# Patient Record
Sex: Male | Born: 1980
Health system: Southern US, Community
[De-identification: ages and names within clinical notes are randomized; demographics above are authoritative.]

## PROBLEM LIST (undated history)

## (undated) DIAGNOSIS — G4733 Obstructive sleep apnea (adult) (pediatric): Secondary | ICD-10-CM

## (undated) DIAGNOSIS — M5030 Other cervical disc degeneration, unspecified cervical region: Secondary | ICD-10-CM

## (undated) DIAGNOSIS — F32A Depression, unspecified: Secondary | ICD-10-CM

## (undated) DIAGNOSIS — Z9989 Dependence on other enabling machines and devices: Secondary | ICD-10-CM

## (undated) DIAGNOSIS — F329 Major depressive disorder, single episode, unspecified: Secondary | ICD-10-CM

## (undated) DIAGNOSIS — F419 Anxiety disorder, unspecified: Secondary | ICD-10-CM

## (undated) DIAGNOSIS — Z92241 Personal history of systemic steroid therapy: Secondary | ICD-10-CM

## (undated) HISTORY — DX: Depression, unspecified: F32.A

## (undated) HISTORY — DX: Anxiety disorder, unspecified: F41.9

## (undated) HISTORY — PX: WISDOM TOOTH EXTRACTION: SHX21

## (undated) HISTORY — DX: Major depressive disorder, single episode, unspecified: F32.9

---
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
## 2017-04-30 NOTE — Progress Notes (Signed)
BP (!) 152/94 (BP Location: Left Arm, Patient Position: Sitting, Cuff Size: Large)   Pulse 98   Temp 97.6 F (36.4 C)   Ht 5' 7.1" (1.704 m)   Wt 223 lb 4 oz (101.3 kg)   SpO2 97%   BMI 34.86 kg/m    Subjective:    Patient ID: Latrelle Bazar, male    DOB: 1980/06/14, 37 y.o.   MRN: [id number]  HPI: Rishabh Rinkenberger is a 37 y.o. male who presents today to establish care.  Chief Complaint  Patient presents with  . Back Pain  . Insomnia  . Allergies   Has been having some pain in his upper back off and on for 4-5 years. It's been pretty bad for the past 1.5 months. Was in the [employer] and that was pretty tough on him. Was seeing a PT at [place]- thought that he might have thoracic outlet syndrome. PT did help a bit, but he stopped going due to issues with the [facility]. Pain is more of an aching pain and then becomes stabbing. Stays right at the shoulder blades. Has some numbness and tingling in his hands, not as bad as it has been. Doesn't seem to be associated with the pain. Has not had any imaging done of his back. Had an EMG of his arms done at the [place] for the numbness and tingling. Hasn't done anything with his arms since then. Comes and goes. Feels better with advil and tylenol. Heat helps, but it's still there. Laying flat on his back helps. Has been rolling it out with tennis balls.   Allergies have been getting worse for the past couple of years. Has been taking the afrin as needed. Using the claritin for 2 months straight and then stopped it. Mainly has itchy eyes, congestion and coughing.   SLEEP APNEA Sleep apnea status: uncontrolled Duration: couple of years Satisfied with current treatment?:  Not on anything CPAP use:  no Sleep quality with CPAP use: poor Treament compliance:Not on anything Last sleep study: never had one Treatments attempted: None Wakes feeling refreshed:  no Daytime hypersomnolence:  yes Fatigue:  yes Insomnia:  yes Good sleep hygiene:  yes Difficulty falling  asleep:  yes Difficulty staying asleep:  yes Snoring bothers bed partner:  yes Observed apnea by bed partner: yes Obesity:  yes Hypertension: yes  Pulmonary hypertension:  no Coronary artery disease:  no   Active Ambulatory Problems    Diagnosis Date Noted  . Environmental allergies 05/01/2017   Resolved Ambulatory Problems    Diagnosis Date Noted  . No Resolved Ambulatory Problems   Past Medical History:  Diagnosis Date  . Anxiety   . Depression    Past Surgical History:  Procedure Laterality Date  . WISDOM TOOTH EXTRACTION  37yo   37yo    Outpatient Encounter Medications as of 05/01/2017  Medication Sig  . loratadine (CLARITIN) 10 MG tablet Take 10 mg by mouth daily.  Marland Kitchen oxymetazoline (AFRIN) 0.05 % nasal spray Place 1 spray into both nostrils 2 (two) times daily.  . cyclobenzaprine (FLEXERIL) 10 MG tablet Take 1 tablet (10 mg total) by mouth at bedtime.  . fexofenadine (ALLEGRA ALLERGY) 180 MG tablet Take 1 tablet (180 mg total) by mouth daily.  . fluticasone (FLONASE) 50 MCG/ACT nasal spray Place 2 sprays into both nostrils daily.  . naproxen (NAPROSYN) 500 MG tablet Take 1 tablet (500 mg total) by mouth 2 (two) times daily with a meal.   No facility-administered encounter medications on  file as of 05/01/2017.    No Known Allergies  Social History   Socioeconomic History  . Marital status: Single    Spouse name: Not on file  . Number of children: Not on file  . Years of education: Not on file  . Highest education level: Not on file  Occupational History  . Not on file  Social Needs  . Financial resource strain: Not on file  . Food insecurity:    Worry: Not on file    Inability: Not on file  . Transportation needs:    Medical: Not on file    Non-medical: Not on file  Tobacco Use  . Smoking status: Former Smoker    Types: Cigarettes  . Smokeless tobacco: Former Neurosurgeon    Types: Chew  Substance and Sexual Activity  . Alcohol use: Yes    Comment: on  occasion  . Drug use: Never  . Sexual activity: Yes  Lifestyle  . Physical activity:    Days per week: Not on file    Minutes per session: Not on file  . Stress: Not on file  Relationships  . Social connections:    Talks on phone: Not on file    Gets together: Not on file    Attends religious service: Not on file    Active member of club or organization: Not on file    Attends meetings of clubs or organizations: Not on file    Relationship status: Not on file  . Intimate partner violence:    Fear of current or ex partner: Not on file    Emotionally abused: Not on file    Physically abused: Not on file    Forced sexual activity: Not on file  Other Topics Concern  . Not on file  Social History Narrative  . Not on file   Family History  Problem Relation Age of Onset  . Drug abuse Father   . Multiple sclerosis Father   . Cancer Maternal Grandmother        Breast and lung     Review of Systems  Constitutional: Negative for activity change, appetite change, chills, diaphoresis, fatigue, fever and unexpected weight change.  HENT: Positive for congestion, sinus pressure and trouble swallowing (has trouble swallowing things, has to throw it up, just solids). Negative for dental problem, drooling, ear discharge, ear pain, facial swelling, hearing loss, mouth sores, nosebleeds, postnasal drip, rhinorrhea, sinus pain, sneezing, sore throat, tinnitus and voice change.   Eyes: Positive for redness and itching. Negative for photophobia, pain, discharge and visual disturbance.  Respiratory: Negative.   Cardiovascular: Negative.   Gastrointestinal: Negative.   Psychiatric/Behavioral: Negative.     Per HPI unless specifically indicated above     Objective:    BP (!) 152/94 (BP Location: Left Arm, Patient Position: Sitting, Cuff Size: Large)   Pulse 98   Temp 97.6 F (36.4 C)   Ht 5' 7.1" (1.704 m)   Wt 223 lb 4 oz (101.3 kg)   SpO2 97%   BMI 34.86 kg/m   Wt Readings from Last  3 Encounters:  05/01/17 223 lb 4 oz (101.3 kg)    Physical Exam  Constitutional: He is oriented to person, place, and time. He appears well-developed and well-nourished. No distress.  HENT:  Head: Normocephalic and atraumatic.  Right Ear: Hearing normal.  Left Ear: Hearing normal.  Nose: Nose normal.  Eyes: Conjunctivae and lids are normal. Right eye exhibits no discharge. Left eye exhibits no discharge. No  scleral icterus.  Cardiovascular: Normal rate, regular rhythm, normal heart sounds and intact distal pulses. Exam reveals no gallop and no friction rub.  No murmur heard. Pulmonary/Chest: Effort normal and breath sounds normal. No respiratory distress. He has no wheezes. He has no rales. He exhibits no tenderness.  Neurological: He is alert and oriented to person, place, and time.  Skin: Skin is warm, dry and intact. No rash noted. He is not diaphoretic. No erythema. No pallor.  Psychiatric: He has a normal mood and affect. His speech is normal and behavior is normal. Judgment and thought content normal. Cognition and memory are normal.  Nursing note and vitals reviewed. Back Exam:    Inspection:  Normal spinal curvature.  No deformity, ecchymosis, erythema, or lesions    Palpation:     Midline spinal tenderness: yes      Paralumbar tenderness: no      Parathoracic tenderness: yes      Buttocks tenderness: no     Range of Motion:      Flexion: Fingers to Ground     Extension:Decreased     Lateral bending:Decreased    Rotation:Decreased    Neuro Exam:Lower extremity DTRs normal & symmetric.  Strength and sensation intact.   No results found for this or any previous visit.    Assessment & Plan:   Problem List Items Addressed This Visit      Other   Environmental allergies    Will treat with allegra and flonase. Call with any concerns. Continue to monitor. Call with any concerns.        Other Visit Diagnoses    Observed sleep apnea    -  Primary   Will obtain sleep  study. Call with any concerns. Await results.    Relevant Orders   Ambulatory referral to Sleep Studies   Chronic midline thoracic back pain       WIll obtain x-ray as he has not had any done. Will treat with naproxen and flexeril, if normal, consider OMT. Await results.    Relevant Medications   naproxen (NAPROSYN) 500 MG tablet   cyclobenzaprine (FLEXERIL) 10 MG tablet   Other Relevant Orders   DG Thoracic Spine W/Swimmers       Follow up plan: Return ASAP, for Procedure visit.

## 2017-05-01 ENCOUNTER — Encounter: Payer: Self-pay | Admitting: Family Medicine

## 2017-05-01 ENCOUNTER — Ambulatory Visit (INDEPENDENT_AMBULATORY_CARE_PROVIDER_SITE_OTHER): Payer: 59 | Admitting: Family Medicine

## 2017-05-01 VITALS — BP 152/94 | HR 98 | Temp 97.6°F | Ht 67.1 in | Wt 223.2 lb

## 2017-05-01 DIAGNOSIS — Z9109 Other allergy status, other than to drugs and biological substances: Secondary | ICD-10-CM | POA: Diagnosis not present

## 2017-05-01 DIAGNOSIS — M546 Pain in thoracic spine: Secondary | ICD-10-CM | POA: Diagnosis not present

## 2017-05-01 DIAGNOSIS — G4730 Sleep apnea, unspecified: Secondary | ICD-10-CM

## 2017-05-01 DIAGNOSIS — G8929 Other chronic pain: Secondary | ICD-10-CM | POA: Diagnosis not present

## 2017-05-01 MED ORDER — FLUTICASONE PROPIONATE 50 MCG/ACT NA SUSP: 2.0000 | Freq: Every day | NASAL | 6 refills | Status: DC

## 2017-05-01 MED ORDER — NAPROXEN 500 MG PO TABS: 500.0000 mg | ORAL_TABLET | Freq: Two times a day (BID) | ORAL | 1 refills | Status: DC

## 2017-05-01 MED ORDER — CYCLOBENZAPRINE HCL 10 MG PO TABS: 10.0000 mg | ORAL_TABLET | Freq: Every day | ORAL | 0 refills | Status: DC

## 2017-05-01 MED ORDER — FEXOFENADINE HCL 180 MG PO TABS: 180.0000 mg | ORAL_TABLET | Freq: Every day | ORAL | 1 refills | Status: DC

## 2017-05-01 NOTE — Assessment & Plan Note (Signed)
Will treat with allegra and flonase. Call with any concerns. Continue to monitor. Call with any concerns.

## 2017-05-02 ENCOUNTER — Telehealth: Payer: Self-pay | Admitting: Family Medicine

## 2017-05-02 ENCOUNTER — Ambulatory Visit
Admission: RE | Admit: 2017-05-02 | Discharge: 2017-05-02 | Disposition: A | Discharge status: Home / Self Care | Payer: 59 | Source: Ambulatory Visit | Attending: Family Medicine | Admitting: Family Medicine

## 2017-05-02 DIAGNOSIS — G8929 Other chronic pain: Secondary | ICD-10-CM

## 2017-05-02 DIAGNOSIS — M546 Pain in thoracic spine: Principal | ICD-10-CM

## 2017-05-02 NOTE — Telephone Encounter (Signed)
Please let him know that his x-ray was negative, so we'll see him next week to see what we can do for his back. Thanks!

## 2017-05-03 NOTE — Telephone Encounter (Signed)
Message relayed to patient. Verbalized understanding and denied questions.   

## 2017-05-09 ENCOUNTER — Encounter: Payer: Self-pay | Admitting: Family Medicine

## 2017-05-09 ENCOUNTER — Ambulatory Visit (INDEPENDENT_AMBULATORY_CARE_PROVIDER_SITE_OTHER): Payer: 59 | Admitting: Family Medicine

## 2017-05-09 VITALS — BP 130/85 | HR 96 | Wt 227.1 lb

## 2017-05-09 DIAGNOSIS — Z23 Encounter for immunization: Secondary | ICD-10-CM | POA: Diagnosis not present

## 2017-05-09 DIAGNOSIS — G8929 Other chronic pain: Secondary | ICD-10-CM | POA: Diagnosis not present

## 2017-05-09 DIAGNOSIS — M9908 Segmental and somatic dysfunction of rib cage: Secondary | ICD-10-CM | POA: Diagnosis not present

## 2017-05-09 DIAGNOSIS — M546 Pain in thoracic spine: Secondary | ICD-10-CM

## 2017-05-09 DIAGNOSIS — M9901 Segmental and somatic dysfunction of cervical region: Secondary | ICD-10-CM | POA: Diagnosis not present

## 2017-05-09 DIAGNOSIS — M9900 Segmental and somatic dysfunction of head region: Secondary | ICD-10-CM

## 2017-05-09 DIAGNOSIS — M9907 Segmental and somatic dysfunction of upper extremity: Secondary | ICD-10-CM

## 2017-05-09 DIAGNOSIS — M9909 Segmental and somatic dysfunction of abdomen and other regions: Secondary | ICD-10-CM

## 2017-05-09 DIAGNOSIS — M9902 Segmental and somatic dysfunction of thoracic region: Secondary | ICD-10-CM | POA: Diagnosis not present

## 2017-05-09 NOTE — Progress Notes (Signed)
BP 130/85 (BP Location: Left Arm, Patient Position: Sitting, Cuff Size: Large)   Pulse 96   Wt 227 lb 2 oz (103 kg)   SpO2 94%   BMI 35.47 kg/m    Subjective:    Patient ID: Shawn Quinn, male    DOB: 02-Aug-1980, 37 y.o.   MRN: [id number]  HPI: Shawn Quinn is a 37 y.o. male  Chief Complaint  Patient presents with  . Back Pain   Shawn Quinn presents today for evaluation and possible treatment with OMT for chronic midline thoracic pain, with a negative x-ray. Pain is located in the middle of his upper back between his shoulder blades. Its aching and stabbing in nature. Feels better with advil and tylenol and worse with certain movements. He was putting together some shelves over the weekend and developed a sharp pain. It's located in his upper back, comes into his chest. Constant for yesterday, better today- now just stiff. No numbness or tingling. No weakness. Better with naproxen and flexeril- didn't help much with this one. Nothing making it worse. Otherwise doing well with no other concerns or complaints at this time.   Relevant past medical, surgical, family and social history reviewed and updated as indicated. Interim medical history since our last visit reviewed. Allergies and medications reviewed and updated.  Review of Systems  Constitutional: Negative.   Respiratory: Negative.   Cardiovascular: Negative.   Musculoskeletal: Positive for back pain and myalgias. Negative for arthralgias, gait problem, joint swelling, neck pain and neck stiffness.  Skin: Negative.   Neurological: Negative.   Psychiatric/Behavioral: Negative.     Per HPI unless specifically indicated above     Objective:    BP 130/85 (BP Location: Left Arm, Patient Position: Sitting, Cuff Size: Large)   Pulse 96   Wt 227 lb 2 oz (103 kg)   SpO2 94%   BMI 35.47 kg/m   Wt Readings from Last 3 Encounters:  05/09/17 227 lb 2 oz (103 kg)  05/01/17 223 lb 4 oz (101.3 kg)    Physical Exam  Constitutional: He  is oriented to person, place, and time. He appears well-developed and well-nourished. No distress.  HENT:  Head: Normocephalic and atraumatic.  Right Ear: Hearing normal.  Left Ear: Hearing normal.  Nose: Nose normal.  Eyes: Conjunctivae and lids are normal. Right eye exhibits no discharge. Left eye exhibits no discharge. No scleral icterus.  Cardiovascular: Normal rate, regular rhythm, normal heart sounds and intact distal pulses. Exam reveals no gallop and no friction rub.  No murmur heard. Pulmonary/Chest: Effort normal and breath sounds normal. No stridor. No respiratory distress. He has no wheezes. He has no rales. He exhibits no tenderness.  Abdominal: Soft. He exhibits no distension and no mass. There is no tenderness. There is no rebound and no guarding. No hernia.  Neurological: He is alert and oriented to person, place, and time.  Skin: Skin is warm, dry and intact. Capillary refill takes less than 2 seconds. No rash noted. He is not diaphoretic. No erythema. No pallor.  Psychiatric: He has a normal mood and affect. His speech is normal and behavior is normal. Judgment and thought content normal. Cognition and memory are normal.  Nursing note and vitals reviewed. Musculoskeletal:  Exam found Decreased ROM, Tissue texture changes, Tenderness to palpation and Asymmetry of patient's  head, neck, thorax, ribs, upper extremity and abdomen Osteopathic Structural Exam:   Head: hypertonic suboccipital muscles, OAESSR  Neck: trap spasm on the R, C3ESRR, SCM hypertonic on  the R  Thorax: T6ESRL, trap spasm on the R  Ribs: Ribs 5-7 locked up on the L, Rib 7 locked up on the R  Upper Extremity: pec spasm bilaterally L>R  Abdomen:  Diaphragm hypertonic bilaterally R>L  No results found for this or any previous visit.    Assessment & Plan:   Problem List Items Addressed This Visit    None    Visit Diagnoses    Chronic midline thoracic back pain    -  Primary   In acute exacerbation at  this time, likely myofascial in nature. I think he would benefit from OMT. Treated today with good results as below.    Immunization due       Tdap given today.   Relevant Orders   Tdap vaccine greater than or equal to 7yo IM (Completed)   Head region somatic dysfunction       Cervical segment dysfunction       Thoracic segment dysfunction       Somatic dysfunction of upper extremities       Rib cage region somatic dysfunction       Segmental dysfunction of abdomen         After verbal consent was obtained, patient was treated today with osteopathic manipulative medicine to the regions of the head, neck, thorax, ribs, abdomen and upper extremity using the techniques of FPR, myofascial release, counterstrain, muscle energy, HVLA and soft tissue. Areas of compensation relating to his primary pain source also treated. Patient tolerated the procedure well with good objective and good subjective improvement in symptoms. He left the room in good condition. He was advised to stay well hydrated and that he may have some soreness following the procedure. If not improving or worsening, he will call and come in. He will return for reevaluation   in 2-3 weeks.   Follow up plan: Return 2-3 weeks, for Procedure visit.

## 2017-05-24 ENCOUNTER — Ambulatory Visit (INDEPENDENT_AMBULATORY_CARE_PROVIDER_SITE_OTHER): Payer: 59 | Admitting: Family Medicine

## 2017-05-24 ENCOUNTER — Encounter: Payer: Self-pay | Admitting: Family Medicine

## 2017-05-24 VITALS — BP 149/102 | HR 97 | Temp 97.4°F | Wt 227.3 lb

## 2017-05-24 DIAGNOSIS — G8929 Other chronic pain: Secondary | ICD-10-CM

## 2017-05-24 DIAGNOSIS — M9908 Segmental and somatic dysfunction of rib cage: Secondary | ICD-10-CM

## 2017-05-24 DIAGNOSIS — M9909 Segmental and somatic dysfunction of abdomen and other regions: Secondary | ICD-10-CM

## 2017-05-24 DIAGNOSIS — M9900 Segmental and somatic dysfunction of head region: Secondary | ICD-10-CM

## 2017-05-24 DIAGNOSIS — M546 Pain in thoracic spine: Secondary | ICD-10-CM | POA: Diagnosis not present

## 2017-05-24 DIAGNOSIS — M9902 Segmental and somatic dysfunction of thoracic region: Secondary | ICD-10-CM | POA: Diagnosis not present

## 2017-05-24 DIAGNOSIS — M9901 Segmental and somatic dysfunction of cervical region: Secondary | ICD-10-CM | POA: Diagnosis not present

## 2017-05-24 NOTE — Progress Notes (Signed)
BP (!) 149/102 (BP Location: Left Arm, Cuff Size: Large)   Pulse 97   Temp (!) 97.4 F (36.3 C)   Wt 227 lb 5 oz (103.1 kg)   SpO2 97%   BMI 35.50 kg/m    Subjective:    Patient ID: Shawn Quinn, male    DOB: 05-23-1980, 37 y.o.   MRN: [id number]  HPI: Shawn Quinn is a 37 y.o. male  Chief Complaint  Patient presents with  . Back Pain   Woke up this AM and his back was killing him. Was on vacation last week and slept on a couch. Did well after last time, not as locked up. Lasted until he went on vacation last week. He notes that it is midline in the middle of his back. Aching and stabbing in nature. Some radiation into his chest. Better with OMT and stretching and medicine. Nothing makes it particularly worse. He is otherwise doing well with no other concerns or complaints at this time.   Relevant past medical, surgical, family and social history reviewed and updated as indicated. Interim medical history since our last visit reviewed. Allergies and medications reviewed and updated.  Review of Systems  Constitutional: Negative.   Respiratory: Negative.   Cardiovascular: Negative.   Musculoskeletal: Positive for back pain and myalgias. Negative for arthralgias, gait problem, joint swelling, neck pain and neck stiffness.  Skin: Negative.   Neurological: Negative.   Psychiatric/Behavioral: Negative.     Per HPI unless specifically indicated above     Objective:    BP (!) 149/102 (BP Location: Left Arm, Cuff Size: Large)   Pulse 97   Temp (!) 97.4 F (36.3 C)   Wt 227 lb 5 oz (103.1 kg)   SpO2 97%   BMI 35.50 kg/m   Wt Readings from Last 3 Encounters:  05/24/17 227 lb 5 oz (103.1 kg)  05/09/17 227 lb 2 oz (103 kg)  05/01/17 223 lb 4 oz (101.3 kg)    Physical Exam  Constitutional: He is oriented to person, place, and time. He appears well-developed and well-nourished. No distress.  HENT:  Head: Normocephalic and atraumatic.  Right Ear: Hearing normal.  Left Ear:  Hearing normal.  Nose: Nose normal.  Eyes: Conjunctivae and lids are normal. Right eye exhibits no discharge. Left eye exhibits no discharge. No scleral icterus.  Cardiovascular: Normal rate, regular rhythm, normal heart sounds and intact distal pulses. Exam reveals no gallop and no friction rub.  No murmur heard. Pulmonary/Chest: Effort normal and breath sounds normal. No stridor. No respiratory distress. He has no wheezes. He has no rales. He exhibits no tenderness.  Neurological: He is alert and oriented to person, place, and time.  Skin: Skin is warm, dry and intact. Capillary refill takes less than 2 seconds. No rash noted. He is not diaphoretic. No erythema. No pallor.  Psychiatric: He has a normal mood and affect. His speech is normal and behavior is normal. Judgment and thought content normal. Cognition and memory are normal.  Nursing note and vitals reviewed. Musculoskeletal:  Exam found Decreased ROM, Tissue texture changes, Tenderness to palpation and Asymmetry of patient's  head, neck, thorax, ribs and abdomen Osteopathic Structural Exam:   Head: hypertonic suboccipital muscles, OAESSR  Neck: C3ESRR, C4ESRL, SCM hypertonic bilaterally R>L  Thorax: Trap spasm bilaterally, T3-5SLRR  Ribs: ribs 3-8 locked up on the R, Ribs 5-7 locked up on the L  Abdomen: diaphragm spasm on the L    No results found for this or  any previous visit.    Assessment & Plan:   Problem List Items Addressed This Visit    None    Visit Diagnoses    Chronic midline thoracic back pain    -  Primary   In acute exacerbation from sleeping on a couch. He has somatic dysfunction contributing to his symptoms. Would benefit from OMT. Treated today as below.    Head region somatic dysfunction       Cervical segment dysfunction       Thoracic segment dysfunction       Rib cage region somatic dysfunction       Segmental dysfunction of abdomen         After verbal consent was obtained, patient was treated today  with osteopathic manipulative medicine to the regions of the head, neck, thorax, ribs and abdomen using the techniques of cranial, myofascial release, counterstrain, muscle energy, HVLA and soft tissue. Areas of compensation relating to his primary pain source also treated. Patient tolerated the procedure well with good objective and good subjective improvement in symptoms. He left the room in good condition. He was advised to stay well hydrated and that he may have some soreness following the procedure. If not improving or worsening, he will call and come in. He will return for reevaluation   in 2-3 weeks if needed.   Follow up plan: Return 2-3 weeks, for Procedure visit.

## 2017-06-01 ENCOUNTER — Other Ambulatory Visit: Payer: Self-pay | Admitting: Family Medicine

## 2017-06-07 ENCOUNTER — Ambulatory Visit (INDEPENDENT_AMBULATORY_CARE_PROVIDER_SITE_OTHER): Payer: 59 | Admitting: Family Medicine

## 2017-06-07 ENCOUNTER — Encounter: Payer: Self-pay | Admitting: Family Medicine

## 2017-06-07 VITALS — BP 130/90 | HR 87 | Temp 98.4°F | Wt 223.6 lb

## 2017-06-07 DIAGNOSIS — M9908 Segmental and somatic dysfunction of rib cage: Secondary | ICD-10-CM | POA: Diagnosis not present

## 2017-06-07 DIAGNOSIS — M9909 Segmental and somatic dysfunction of abdomen and other regions: Secondary | ICD-10-CM

## 2017-06-07 DIAGNOSIS — M9907 Segmental and somatic dysfunction of upper extremity: Secondary | ICD-10-CM

## 2017-06-07 DIAGNOSIS — G8929 Other chronic pain: Secondary | ICD-10-CM | POA: Diagnosis not present

## 2017-06-07 DIAGNOSIS — M9902 Segmental and somatic dysfunction of thoracic region: Secondary | ICD-10-CM | POA: Diagnosis not present

## 2017-06-07 DIAGNOSIS — M546 Pain in thoracic spine: Secondary | ICD-10-CM

## 2017-06-07 MED ORDER — TRAMADOL HCL 50 MG PO TABS: 50.0000 mg | ORAL_TABLET | Freq: Three times a day (TID) | ORAL | 0 refills | Status: DC | PRN

## 2017-06-07 MED ORDER — TRIAMCINOLONE ACETONIDE 40 MG/ML IJ SUSP
40.0000 mg | Freq: Once | INTRAMUSCULAR | Status: AC
  Administered 2017-06-07: 40 mg via INTRAMUSCULAR

## 2017-06-07 MED ORDER — PREDNISONE 50 MG PO TABS: 50.0000 mg | ORAL_TABLET | Freq: Every day | ORAL | 0 refills | Status: DC

## 2017-06-07 NOTE — Progress Notes (Signed)
BP 130/90 (BP Location: Left Arm, Patient Position: Sitting, Cuff Size: Large)   Pulse 87   Temp 98.4 F (36.9 C)   Wt 223 lb 9 oz (101.4 kg)   SpO2 98%   BMI 34.91 kg/m    Subjective:    Patient ID: Shawn Quinn, male    DOB: 1981/01/05, 37 y.o.   MRN: [id number]  HPI: Cire Clute is a 37 y.o. male  Chief Complaint  Patient presents with  . Back Pain   Eliyas has not been doing well for about the past 2 weeks. He notes that he did a bit better following his last treatment, but was still in a lot of pain. He has been feeling tight and sore in between his shoulder blades, much worse on the L than the right with radiation around the rib to the front. Better with stretching and OMT and medicine. Worse with certain movements and working on the computer. No numbness or tingling. Otherwise feeling well. No other concerns or complaints at this time.   Relevant past medical, surgical, family and social history reviewed and updated as indicated. Interim medical history since our last visit reviewed. Allergies and medications reviewed and updated.  Review of Systems  Constitutional: Negative.   Respiratory: Negative.   Cardiovascular: Negative.   Musculoskeletal: Positive for back pain and myalgias. Negative for arthralgias, gait problem, joint swelling, neck pain and neck stiffness.  Skin: Negative.   Neurological: Negative.   Psychiatric/Behavioral: Negative.     Per HPI unless specifically indicated above     Objective:    BP 130/90 (BP Location: Left Arm, Patient Position: Sitting, Cuff Size: Large)   Pulse 87   Temp 98.4 F (36.9 C)   Wt 223 lb 9 oz (101.4 kg)   SpO2 98%   BMI 34.91 kg/m   Wt Readings from Last 3 Encounters:  06/07/17 223 lb 9 oz (101.4 kg)  05/24/17 227 lb 5 oz (103.1 kg)  05/09/17 227 lb 2 oz (103 kg)    Physical Exam  Constitutional: He is oriented to person, place, and time. He appears well-developed and well-nourished. No distress.  HENT:    Head: Normocephalic and atraumatic.  Right Ear: Hearing normal.  Left Ear: Hearing normal.  Nose: Nose normal.  Eyes: Conjunctivae and lids are normal. Right eye exhibits no discharge. Left eye exhibits no discharge. No scleral icterus.  Cardiovascular: Normal rate, regular rhythm, normal heart sounds and intact distal pulses. Exam reveals no gallop and no friction rub.  No murmur heard. Pulmonary/Chest: Effort normal and breath sounds normal. No stridor. No respiratory distress. He has no wheezes. He has no rales. He exhibits no tenderness.  Abdominal: Soft. He exhibits no distension and no mass. There is no tenderness. There is no rebound and no guarding. No hernia.  Neurological: He is alert and oriented to person, place, and time.  Skin: Skin is warm, dry and intact. Capillary refill takes less than 2 seconds. No rash noted. He is not diaphoretic. No erythema. No pallor.  Psychiatric: He has a normal mood and affect. His speech is normal and behavior is normal. Judgment and thought content normal. Cognition and memory are normal.  Musculoskeletal:  Exam found Decreased ROM, Tissue texture changes, Tenderness to palpation and Asymmetry of patient's  thorax, ribs, upper extremity and abdomen Osteopathic Structural Exam:   Thorax: T3-5SRRL, latisimus dorsi hypertonic on the L with fascial drag into the ribs and shoulder  Ribs: Ribs 6-8 locked up on the  L, Rib 7 locked up on the R  Upper Extremity: Pec hypertonic on the L, fascial drag through L humerus into his pec and ribs  Abdomen: diaphragm spasm on the L   No results found for this or any previous visit.    Assessment & Plan:   Problem List Items Addressed This Visit      Other   Chronic midline thoracic back pain - Primary    Continues in exacerbation. Will treat with kenalog shot today and burst of prednisone starting tomorrow. Traveling to [place] next week- will give some tramadol to help stay comfortable. 1 week supply  given today. He does have some somatic dysfunction that seems to be contributing to his symptoms. Treated today with good results as below. Call with any concerns.       Relevant Medications   predniSONE (DELTASONE) 50 MG tablet   triamcinolone acetonide (KENALOG-40) injection 40 mg (Completed)   traMADol (ULTRAM) 50 MG tablet    Other Visit Diagnoses    Thoracic segment dysfunction       Rib cage region somatic dysfunction       Segmental dysfunction of abdomen       Somatic dysfunction of upper extremities         After verbal consent was obtained, patient was treated today with osteopathic manipulative medicine to the regions of the thorax, ribs, abdomen and upper extremity using the techniques of FPR, myofascial release, counterstrain, muscle energy, HVLA and soft tissue. Areas of compensation relating to his primary pain source also treated. Patient tolerated the procedure well with good objective and good subjective improvement in symptoms. He left the room in good condition. He was advised to stay well hydrated and that he may have some soreness following the procedure. If not improving or worsening, he will call and come in. Home exercise program of stretches for paraspinals, traps and pecs discussed and demonstrated today. Patient will do these stretches BID to before the point of pain, and will return for reevaluation  in 1-2 weeks if needed.   Follow up plan: Return 1-2 weeks, for procedure visit.

## 2017-06-07 NOTE — Assessment & Plan Note (Signed)
Continues in exacerbation. Will treat with kenalog shot today and burst of prednisone starting tomorrow. Traveling to [place] next week- will give some tramadol to help stay comfortable. 1 week supply given today. He does have some somatic dysfunction that seems to be contributing to his symptoms. Treated today with good results as below. Call with any concerns.

## 2017-06-19 ENCOUNTER — Ambulatory Visit: Payer: 59 | Admitting: Family Medicine

## 2017-06-19 DIAGNOSIS — G4733 Obstructive sleep apnea (adult) (pediatric): Secondary | ICD-10-CM | POA: Diagnosis not present

## 2017-06-28 ENCOUNTER — Other Ambulatory Visit: Payer: Self-pay | Admitting: Family Medicine

## 2017-06-29 NOTE — Telephone Encounter (Signed)
Refill request Ultram  LOV 06/07/2017 Olevia Perches  Last filled 06/07/2017  1 tab q 8 hr prn  NR  QTY 21  [facility] on [employer]

## 2017-07-03 ENCOUNTER — Ambulatory Visit: Payer: 59 | Admitting: Family Medicine

## 2017-07-06 ENCOUNTER — Ambulatory Visit (INDEPENDENT_AMBULATORY_CARE_PROVIDER_SITE_OTHER): Payer: 59 | Admitting: Family Medicine

## 2017-07-06 ENCOUNTER — Encounter: Payer: Self-pay | Admitting: Family Medicine

## 2017-07-06 VITALS — BP 136/94 | HR 95 | Wt 218.0 lb

## 2017-07-06 DIAGNOSIS — G8929 Other chronic pain: Secondary | ICD-10-CM

## 2017-07-06 DIAGNOSIS — M9900 Segmental and somatic dysfunction of head region: Secondary | ICD-10-CM

## 2017-07-06 DIAGNOSIS — M9908 Segmental and somatic dysfunction of rib cage: Secondary | ICD-10-CM

## 2017-07-06 DIAGNOSIS — M546 Pain in thoracic spine: Secondary | ICD-10-CM | POA: Diagnosis not present

## 2017-07-06 DIAGNOSIS — M9901 Segmental and somatic dysfunction of cervical region: Secondary | ICD-10-CM | POA: Diagnosis not present

## 2017-07-06 DIAGNOSIS — M9909 Segmental and somatic dysfunction of abdomen and other regions: Secondary | ICD-10-CM

## 2017-07-06 DIAGNOSIS — M9902 Segmental and somatic dysfunction of thoracic region: Secondary | ICD-10-CM | POA: Diagnosis not present

## 2017-07-06 NOTE — Assessment & Plan Note (Signed)
Has been doing better and worse off and on. In general more mobility than when he started treatment and feeling better. He does have some somatic dysfunction that seems to be contributing to his symptoms. Treated today with good results as below. Call with any concerns.

## 2017-07-06 NOTE — Progress Notes (Signed)
BP (!) 136/94 (BP Location: Left Arm, Patient Position: Sitting, Cuff Size: Large)   Pulse 95   Wt 218 lb (98.9 kg)   BMI 34.04 kg/m    Subjective:    Patient ID: Shawn Quinn, male    DOB: 1980/03/14, 37 y.o.   MRN: [id number]  HPI: Shawn Quinn is a 37 y.o. male  Chief Complaint  Patient presents with  . Back Pain   Did OK on the trip. Did well about the week after, then got bad over memorial day. Last night was the first time it wasn't bad. Has been sleeping on the couch. Pain has been a little worse. Coming and going fast. Better with heat. Tramadol didn't really help and made him feel groggy. Better with stretching and OMT. Worse with riding in cars and certain positions. No radiation. He is otherwise feeling well with no other concerns or complaints at this time.   Relevant past medical, surgical, family and social history reviewed and updated as indicated. Interim medical history since our last visit reviewed. Allergies and medications reviewed and updated.  Review of Systems  Constitutional: Negative.   Respiratory: Negative.   Cardiovascular: Negative.   Musculoskeletal: Positive for back pain and myalgias. Negative for arthralgias, gait problem, joint swelling, neck pain and neck stiffness.  Skin: Negative.   Neurological: Negative.  Negative for weakness.  Hematological: Negative.   Psychiatric/Behavioral: Negative.     Per HPI unless specifically indicated above     Objective:    BP (!) 136/94 (BP Location: Left Arm, Patient Position: Sitting, Cuff Size: Large)   Pulse 95   Wt 218 lb (98.9 kg)   BMI 34.04 kg/m   Wt Readings from Last 3 Encounters:  07/06/17 218 lb (98.9 kg)  06/07/17 223 lb 9 oz (101.4 kg)  05/24/17 227 lb 5 oz (103.1 kg)    Physical Exam  Constitutional: He is oriented to person, place, and time. He appears well-developed and well-nourished. No distress.  HENT:  Head: Normocephalic and atraumatic.  Right Ear: Hearing normal.  Left  Ear: Hearing normal.  Nose: Nose normal.  Eyes: Conjunctivae and lids are normal. Right eye exhibits no discharge. Left eye exhibits no discharge. No scleral icterus.  Cardiovascular: Normal rate, regular rhythm, normal heart sounds and intact distal pulses. Exam reveals no gallop and no friction rub.  No murmur heard. Pulmonary/Chest: Effort normal and breath sounds normal. No stridor. No respiratory distress. He has no wheezes. He has no rales. He exhibits no tenderness.  Abdominal: Soft. He exhibits no distension and no mass. There is no tenderness. There is no rebound and no guarding. No hernia.  Neurological: He is alert and oriented to person, place, and time.  Skin: Skin is warm, dry and intact. Capillary refill takes less than 2 seconds. No rash noted. He is not diaphoretic. No erythema. No pallor.  Psychiatric: He has a normal mood and affect. His speech is normal and behavior is normal. Judgment and thought content normal. Cognition and memory are normal.  Nursing note and vitals reviewed. Musculoskeletal:  Exam found Decreased ROM, Tissue texture changes, Tenderness to palpation and Asymmetry of patient's  head, neck, thorax, ribs and abdomen Osteopathic Structural Exam:   Head: OAESSR, hypertonic suboccipital muscles  Neck: SCM hypertonic on the R, Scalenes hypertonic on the R  Thorax: T3-5SRRL  Ribs: Ribs 4-9 locked up on the L, Rib 10 locked up on the R  Abdomen: Diaphragm spasm on the L   No results  found for this or any previous visit.    Assessment & Plan:   Problem List Items Addressed This Visit      Other   Chronic midline thoracic back pain - Primary    Has been doing better and worse off and on. In general more mobility than when he started treatment and feeling better. He does have some somatic dysfunction that seems to be contributing to his symptoms. Treated today with good results as below. Call with any concerns.        Other Visit Diagnoses    Thoracic  segment dysfunction       Segmental dysfunction of abdomen       Head region somatic dysfunction       Cervical segment dysfunction       Rib cage region somatic dysfunction         After verbal consent was obtained, patient was treated today with osteopathic manipulative medicine to the regions of the head, neck, thorax, ribs and abdomen using the techniques of myofascial release, counterstrain, muscle energy, HVLA and soft tissue. Areas of compensation relating to his primary pain source also treated. Patient tolerated the procedure well with good objective and good subjective improvement in symptoms. He left the room in good condition. He was advised to stay well hydrated and that he may have some soreness following the procedure. If not improving or worsening, he will call and come in. He will return for reevaluation  in 1-2 months.   Follow up plan: Return in about 1 month (around 08/03/2017).

## 2017-07-13 ENCOUNTER — Other Ambulatory Visit: Payer: Self-pay | Admitting: Family Medicine

## 2017-07-24 ENCOUNTER — Ambulatory Visit (INDEPENDENT_AMBULATORY_CARE_PROVIDER_SITE_OTHER): Payer: 59 | Admitting: Family Medicine

## 2017-07-24 ENCOUNTER — Encounter: Payer: Self-pay | Admitting: Family Medicine

## 2017-07-24 VITALS — BP 152/85 | HR 91 | Wt 217.0 lb

## 2017-07-24 DIAGNOSIS — M546 Pain in thoracic spine: Secondary | ICD-10-CM | POA: Diagnosis not present

## 2017-07-24 DIAGNOSIS — G8929 Other chronic pain: Secondary | ICD-10-CM

## 2017-07-24 DIAGNOSIS — M25512 Pain in left shoulder: Secondary | ICD-10-CM | POA: Diagnosis not present

## 2017-07-24 NOTE — Assessment & Plan Note (Signed)
In acute exacerbation with trigger point of L rhomboid and trap. Will treat with trigger point injection as below. Call with any concerns.

## 2017-07-24 NOTE — Progress Notes (Signed)
BP (!) 152/85 (BP Location: Left Arm, Patient Position: Sitting, Cuff Size: Large)   Pulse 91   Wt 217 lb (98.4 kg)   SpO2 96%   BMI 33.89 kg/m    Subjective:    Patient ID: Shawn Quinn, male    DOB: 12/17/1980, 37 y.o.   MRN: [id number]  HPI: Shawn Quinn is a 37 y.o. male  Chief Complaint  Patient presents with  . Back Pain   Did better for about 2 days, then got worse for about a week and then seemed to get way worse and had to miss 2.5 days of work. Still tight and sore in his neck and upper L back. Seems to be cutting through his chest rather than radiating up or down. Pain is worse with movement and sleep and better with rest, although nothing is really making it better right now. He is otherwise feeling well with no other concerns or complaints at this time.   Relevant past medical, surgical, family and social history reviewed and updated as indicated. Interim medical history since our last visit reviewed. Allergies and medications reviewed and updated.  Review of Systems  Constitutional: Negative.   Respiratory: Negative.   Cardiovascular: Negative.   Musculoskeletal: Positive for back pain, myalgias, neck pain and neck stiffness. Negative for arthralgias, gait problem and joint swelling.  Skin: Negative.   Neurological: Negative.   Psychiatric/Behavioral: Negative.     Per HPI unless specifically indicated above     Objective:    BP (!) 152/85 (BP Location: Left Arm, Patient Position: Sitting, Cuff Size: Large)   Pulse 91   Wt 217 lb (98.4 kg)   SpO2 96%   BMI 33.89 kg/m   Wt Readings from Last 3 Encounters:  07/24/17 217 lb (98.4 kg)  07/06/17 218 lb (98.9 kg)  06/07/17 223 lb 9 oz (101.4 kg)    Physical Exam  Constitutional: He is oriented to person, place, and time. He appears well-developed and well-nourished. No distress.  HENT:  Head: Normocephalic and atraumatic.  Right Ear: Hearing normal.  Left Ear: Hearing normal.  Nose: Nose normal.    Eyes: Conjunctivae and lids are normal. Right eye exhibits no discharge. Left eye exhibits no discharge. No scleral icterus.  Cardiovascular: Normal rate, regular rhythm, normal heart sounds and intact distal pulses. Exam reveals no gallop and no friction rub.  No murmur heard. Pulmonary/Chest: Effort normal and breath sounds normal. No stridor. No respiratory distress. He has no wheezes. He has no rales. He exhibits no tenderness.  Musculoskeletal: He exhibits tenderness.  Trigger points in L rhomboid and trap  Neurological: He is alert and oriented to person, place, and time.  Skin: Skin is warm, dry and intact. Capillary refill takes less than 2 seconds. No rash noted. He is not diaphoretic. No erythema. No pallor.  Psychiatric: He has a normal mood and affect. His speech is normal and behavior is normal. Judgment and thought content normal. Cognition and memory are normal.  Nursing note and vitals reviewed.   No results found for this or any previous visit.    Assessment & Plan:   Problem List Items Addressed This Visit      Other   Chronic midline thoracic back pain - Primary    In acute exacerbation with trigger point of L rhomboid and trap. Will treat with trigger point injection as below. Call with any concerns.        Other Visit Diagnoses    Trigger point of  shoulder region, left       As below.      Trigger Point Soft Tissue INJECTION  Procedure: trigger point injection of the L rhomboid and trap  Diagnosis: Trigger point L rhomboid and trap  Physician: MJ Consent: Risks benefits, and alternative treatments discussed and all questions were answered. Patient elected to proceed and verbal consent obtained.  Description:Area prepped using semi-sterile technique. 25 gauge needle introduced into the rhomboid and trap muscles in 5 spots and 5.0 cc of lidocaine administered. Patient tolerated the procedure well and had a significant decrease in his pain immediately  following treatment. He left the room in good condition. Breath sounds audible following procedure Complications: none Post Procedure Instructions: He will keep area clean and call if any bleeding, redness or pain. Hee knows to go to the ER with any acute onset shortness of breath.     Follow up plan: Return 1-2 weeks.

## 2017-08-07 ENCOUNTER — Ambulatory Visit (INDEPENDENT_AMBULATORY_CARE_PROVIDER_SITE_OTHER): Payer: 59 | Admitting: Family Medicine

## 2017-08-07 ENCOUNTER — Encounter: Payer: Self-pay | Admitting: Family Medicine

## 2017-08-07 ENCOUNTER — Other Ambulatory Visit: Payer: Self-pay

## 2017-08-07 VITALS — BP 136/89 | HR 96 | Temp 98.5°F | Ht 67.0 in | Wt 214.8 lb

## 2017-08-07 DIAGNOSIS — M546 Pain in thoracic spine: Secondary | ICD-10-CM

## 2017-08-07 DIAGNOSIS — G8929 Other chronic pain: Secondary | ICD-10-CM

## 2017-08-07 MED ORDER — HYDROCODONE-ACETAMINOPHEN 5-325 MG PO TABS: 0.5000 | ORAL_TABLET | Freq: Three times a day (TID) | ORAL | 0 refills | Status: DC | PRN

## 2017-08-07 MED ORDER — TIZANIDINE HCL 4 MG PO CAPS: 4.0000 mg | ORAL_CAPSULE | Freq: Three times a day (TID) | ORAL | 1 refills | Status: DC

## 2017-08-07 MED ORDER — GABAPENTIN 100 MG PO CAPS: ORAL_CAPSULE | ORAL | 3 refills | Status: DC

## 2017-08-07 NOTE — Progress Notes (Signed)
BP 136/89   Pulse 96   Temp 98.5 F (36.9 C) (Oral)   Ht 5\' 7"  (1.702 m)   Wt 214 lb 12.8 oz (97.4 kg)   SpO2 95%   BMI 33.64 kg/m    Subjective:    Patient ID: Shawn Quinn Rainey, male    DOB: 12/14/1980, 37 y.o.   MRN: [id number]  HPI: Shawn Quinn Meche is a 37 y.o. male  Chief Complaint  Patient presents with  . Follow-up    medication adjustment per patient   Mellody DanceKeith presents today not doing well. He states that he hurt a lot after the trigger point injection. He was worse after the 1st day. Got some plywood and memory phone and has been sleeping better for the past couple days, but still not feeling good. Avoiding most activities. He's hoping that he is going to be feeling better with his new sleeping situation shortly. He notes that he is awake about 2 hours every night. His pain is aching and sore in nature, worse with sleeping and better with medication occasionally. Pain radiates around his chest. Pain is pretty constant. He notes that he had an MRI of his arm about 6 years ago. Would like to hold off on MRI for right now due to cost. He notes that the tramadol numbed the pain a little bit, but didn't help him much. He notes that regular activities seem to make it worse- just going grocery shopping. He is otherwise doing well with no other concerns or complaints at this time.   Relevant past medical, surgical, family and social history reviewed and updated as indicated. Interim medical history since our last visit reviewed. Allergies and medications reviewed and updated.  Review of Systems  Constitutional: Negative.   Respiratory: Negative.   Cardiovascular: Negative.   Musculoskeletal: Positive for back pain and myalgias. Negative for arthralgias, gait problem, joint swelling, neck pain and neck stiffness.  Skin: Negative.   Neurological: Negative.   Psychiatric/Behavioral: Negative.     Per HPI unless specifically indicated above     Objective:    BP 136/89   Pulse 96    Temp 98.5 F (36.9 C) (Oral)   Ht 5\' 7"  (1.702 m)   Wt 214 lb 12.8 oz (97.4 kg)   SpO2 95%   BMI 33.64 kg/m   Wt Readings from Last 3 Encounters:  08/07/17 214 lb 12.8 oz (97.4 kg)  07/24/17 217 lb (98.4 kg)  07/06/17 218 lb (98.9 kg)    Physical Exam  Constitutional: He is oriented to person, place, and time. He appears well-developed and well-nourished. No distress.  HENT:  Head: Normocephalic and atraumatic.  Right Ear: Hearing normal.  Left Ear: Hearing normal.  Nose: Nose normal.  Eyes: Conjunctivae and lids are normal. Right eye exhibits no discharge. Left eye exhibits no discharge. No scleral icterus.  Cardiovascular: Normal rate, regular rhythm, normal heart sounds and intact distal pulses. Exam reveals no gallop and no friction rub.  No murmur heard. Pulmonary/Chest: Effort normal and breath sounds normal. No stridor. No respiratory distress. He has no wheezes. He has no rales. He exhibits no tenderness.  Musculoskeletal: He exhibits tenderness. He exhibits no edema or deformity.  Neurological: He is alert and oriented to person, place, and time.  Skin: Skin is warm, dry and intact. Capillary refill takes less than 2 seconds. No rash noted. He is not diaphoretic. No erythema. No pallor.  Psychiatric: He has a normal mood and affect. His speech is normal and  behavior is normal. Judgment and thought content normal. Cognition and memory are normal.  Nursing note and vitals reviewed.   No results found for this or any previous visit.    Assessment & Plan:   Problem List Items Addressed This Visit      Other   Chronic midline thoracic back pain - Primary    Not under good control. Will change from flexeril to tizanidine. Will start him on gabapentin and PRN vicodin for severe pain. Referral to pain management made today. Call with any concerns.       Relevant Medications   ibuprofen (ADVIL,MOTRIN) 100 MG/5ML suspension   tiZANidine (ZANAFLEX) 4 MG capsule   gabapentin  (NEURONTIN) 100 MG capsule   HYDROcodone-acetaminophen (NORCO/VICODIN) 5-325 MG tablet   Other Relevant Orders   Ambulatory referral to Pain Clinic       Follow up plan: Return 3 weeks.

## 2017-08-07 NOTE — Assessment & Plan Note (Signed)
Not under good control. Will change from flexeril to tizanidine. Will start him on gabapentin and PRN vicodin for severe pain. Referral to pain management made today. Call with any concerns.

## 2017-08-10 ENCOUNTER — Emergency Department: Payer: 59

## 2017-08-10 ENCOUNTER — Emergency Department
Admission: EM | Admit: 2017-08-10 | Discharge: 2017-08-10 | Disposition: A | Discharge status: Home / Self Care | Payer: 59 | Attending: Emergency Medicine | Admitting: Emergency Medicine

## 2017-08-10 ENCOUNTER — Other Ambulatory Visit: Payer: Self-pay

## 2017-08-10 ENCOUNTER — Encounter: Payer: Self-pay | Admitting: Emergency Medicine

## 2017-08-10 DIAGNOSIS — N201 Calculus of ureter: Secondary | ICD-10-CM | POA: Diagnosis not present

## 2017-08-10 DIAGNOSIS — Z87891 Personal history of nicotine dependence: Secondary | ICD-10-CM | POA: Insufficient documentation

## 2017-08-10 DIAGNOSIS — R109 Unspecified abdominal pain: Secondary | ICD-10-CM | POA: Diagnosis not present

## 2017-08-10 DIAGNOSIS — R1111 Vomiting without nausea: Secondary | ICD-10-CM | POA: Diagnosis not present

## 2017-08-10 DIAGNOSIS — R1031 Right lower quadrant pain: Secondary | ICD-10-CM | POA: Insufficient documentation

## 2017-08-10 DIAGNOSIS — R110 Nausea: Secondary | ICD-10-CM | POA: Diagnosis not present

## 2017-08-10 LAB — URINALYSIS, COMPLETE (UACMP) WITH MICROSCOPIC
Bilirubin Urine: NEGATIVE
GLUCOSE, UA: NEGATIVE mg/dL
Ketones, ur: NEGATIVE mg/dL
LEUKOCYTES UA: NEGATIVE
NITRITE: NEGATIVE
PROTEIN: 100 mg/dL — AB
RBC / HPF: >50 RBC/hpf — ABNORMAL HIGH (ref 0–5)
Specific Gravity, Urine: 1.024 (ref 1.005–1.030)
Squamous Epithelial / LPF: NONE SEEN (ref 0–5)
WBC, UA: NONE SEEN WBC/hpf (ref 0–5)
pH: 5 (ref 5.0–8.0)

## 2017-08-10 LAB — BASIC METABOLIC PANEL
Anion gap: 8 (ref 5–15)
BUN: 11 mg/dL (ref 6–20)
CHLORIDE: 104 mmol/L (ref 98–111)
CO2: 25 mmol/L (ref 22–32)
Calcium: 8.8 mg/dL — ABNORMAL LOW (ref 8.9–10.3)
Creatinine, Ser: 1.19 mg/dL (ref 0.61–1.24)
GFR calc Af Amer: >60 mL/min (ref >60)
GLUCOSE: 122 mg/dL — AB (ref 70–99)
POTASSIUM: 3.4 mmol/L — AB (ref 3.5–5.1)
Sodium: 137 mmol/L (ref 135–145)

## 2017-08-10 LAB — CBC WITH DIFFERENTIAL/PLATELET
Basophils Absolute: 0 10*3/uL (ref 0–0.1)
Basophils Relative: 0 %
EOS PCT: 0 %
Eosinophils Absolute: 0 10*3/uL (ref 0–0.7)
HCT: 44.9 % (ref 40.0–52.0)
Hemoglobin: 15.5 g/dL (ref 13.0–18.0)
LYMPHS ABS: 0.9 10*3/uL — AB (ref 1.0–3.6)
LYMPHS PCT: 6 %
MCH: 29.8 pg (ref 26.0–34.0)
MCHC: 34.4 g/dL (ref 32.0–36.0)
MCV: 86.4 fL (ref 80.0–100.0)
MONO ABS: 0.8 10*3/uL (ref 0.2–1.0)
Monocytes Relative: 5 %
Neutro Abs: 13.4 10*3/uL — ABNORMAL HIGH (ref 1.4–6.5)
Neutrophils Relative %: 89 %
PLATELETS: 334 10*3/uL (ref 150–440)
RBC: 5.2 MIL/uL (ref 4.40–5.90)
RDW: 12.7 % (ref 11.5–14.5)
WBC: 15.2 10*3/uL — ABNORMAL HIGH (ref 3.8–10.6)

## 2017-08-10 MED ORDER — ONDANSETRON HCL 4 MG/2ML IJ SOLN
4.0000 mg | Freq: Once | INTRAMUSCULAR | Status: AC
  Administered 2017-08-10: 4 mg via INTRAVENOUS
  Filled 2017-08-10: qty 2

## 2017-08-10 MED ORDER — KETOROLAC TROMETHAMINE 30 MG/ML IJ SOLN
30.0000 mg | Freq: Once | INTRAMUSCULAR | Status: AC
  Administered 2017-08-10: 30 mg via INTRAVENOUS
  Filled 2017-08-10: qty 1

## 2017-08-10 MED ORDER — SODIUM CHLORIDE 0.9 % IV BOLUS
1000.0000 mL | Freq: Once | INTRAVENOUS | Status: AC
  Administered 2017-08-10: 1000 mL via INTRAVENOUS

## 2017-08-10 MED ORDER — IBUPROFEN 600 MG PO TABS: 600.0000 mg | ORAL_TABLET | Freq: Four times a day (QID) | ORAL | 0 refills | Status: AC | PRN

## 2017-08-10 NOTE — ED Provider Notes (Signed)
[facility] Emergency Department Provider Note ____________________________________________   First MD Initiated Contact with Patient 08/10/17 [phone]     (approximate)  I have reviewed the triage vital signs and the nursing notes.   HISTORY  Chief Complaint Abdominal Pain and Nausea    HPI Shawn Quinn is a 37 y.o. male with PMH as noted below who presents with right flank and right lower quadrant pain, acute onset 3 hours ago, constant since that time, nonradiating, and associated with nausea and one episode of vomiting.  The patient also reports difficulty urinating and some hematuria this morning.  No prior history of this pain.  No prior history of kidney stones.   Past Medical History:  Diagnosis Date  . Anxiety   . Depression     Patient Active Problem List   Diagnosis Date Noted  . Chronic midline thoracic back pain 06/07/2017  . Environmental allergies 05/01/2017    Past Surgical History:  Procedure Laterality Date  . WISDOM TOOTH EXTRACTION  37yo   37yo    Prior to Admission medications   Medication Sig Start Date End Date Taking? Authorizing Provider  fexofenadine (ALLEGRA ALLERGY) 180 MG tablet Take 1 tablet (180 mg total) by mouth daily. 05/01/17   Johnson, Megan P, DO  fluticasone (FLONASE) 50 MCG/ACT nasal spray Place 2 sprays into both nostrils daily. 05/01/17   Johnson, Megan P, DO  gabapentin (NEURONTIN) 100 MG capsule Take 1 at bedtime for 1 week, then 1 BID for 1 week, then 1 TID for 1 week. 08/07/17   Johnson, Megan P, DO  HYDROcodone-acetaminophen (NORCO/VICODIN) 5-325 MG tablet Take 0.5-1 tablets by mouth every 8 (eight) hours as needed for moderate pain. 08/07/17   Johnson, Megan P, DO  ibuprofen (ADVIL,MOTRIN) 600 MG tablet Take 1 tablet (600 mg total) by mouth every 6 (six) hours as needed. 08/10/17   Dionne BucySiadecki, Mihail Prettyman, MD  naproxen (NAPROSYN) 500 MG tablet TAKE 1 TABLET BY MOUTH TWICE DAILY WITH A MEAL 07/13/17   Johnson, Megan P,  DO  tiZANidine (ZANAFLEX) 4 MG capsule Take 1 capsule (4 mg total) by mouth 3 (three) times daily. 08/07/17   Dorcas CarrowJohnson, Megan P, DO    Allergies Patient has no known allergies.  Family History  Problem Relation Age of Onset  . Drug abuse Father   . Multiple sclerosis Father   . Cancer Maternal Grandmother        Breast and lung    Social History Social History   Tobacco Use  . Smoking status: Former Smoker    Types: Cigarettes  . Smokeless tobacco: Former NeurosurgeonUser    Types: Chew  Substance Use Topics  . Alcohol use: Yes    Comment: on occasion  . Drug use: Never    Review of Systems  Constitutional: No fever. Eyes: No redness. ENT: No sore throat. Cardiovascular: Denies chest pain. Respiratory: Denies shortness of breath. Gastrointestinal: Positive for nausea and one episode of vomiting. Genitourinary: Positive for hematuria.  Musculoskeletal: Negative for back pain. Skin: Negative for rash. Neurological: Negative for headache.   ____________________________________________   PHYSICAL EXAM:  VITAL SIGNS: ED Triage Vitals  Enc Vitals Group     BP 08/10/17 0724 (!) 164/126     Pulse Rate 08/10/17 0724 80     Resp 08/10/17 0724 16     Temp 08/10/17 0724 97.6 F (36.4 C)     Temp Source 08/10/17 0724 Oral     SpO2 08/10/17 0724 97 %  Weight 08/10/17 0725 214 lb (97.1 kg)     Height 08/10/17 0725 5\' 7"  (1.702 m)     Head Circumference --      Peak Flow --      Pain Score 08/10/17 0724 7     Pain Loc --      Pain Edu? --      Excl. in GC? --     Constitutional: Alert and oriented.  Uncomfortable appearing but in no acute distress. Eyes: Conjunctivae are normal.  Head: Atraumatic. Nose: No congestion/rhinnorhea. Mouth/Throat: Mucous membranes are moist.   Neck: Normal range of motion.  Cardiovascular: Good peripheral circulation. Respiratory: Normal respiratory effort.  Gastrointestinal: Soft and nontender. No distention.  Genitourinary: No CVA  tenderness. Musculoskeletal: No lower extremity edema.  Extremities warm and well perfused.  Neurologic:  Normal speech and language. No gross focal neurologic deficits are appreciated.  Skin:  Skin is warm and dry. No rash noted. Psychiatric: Mood and affect are normal. Speech and behavior are normal.  ____________________________________________   LABS (all labs ordered are listed, but only abnormal results are displayed)  Labs Reviewed  URINALYSIS, COMPLETE (UACMP) WITH MICROSCOPIC - Abnormal; Notable for the following components:      Result Value   Color, Urine AMBER (*)    APPearance CLOUDY (*)    Hgb urine dipstick LARGE (*)    Protein, ur 100 (*)    RBC / HPF >50 (*)    Bacteria, UA MANY (*)    All other components within normal limits  BASIC METABOLIC PANEL - Abnormal; Notable for the following components:   Potassium 3.4 (*)    Glucose, Bld 122 (*)    Calcium 8.8 (*)    All other components within normal limits  CBC WITH DIFFERENTIAL/PLATELET - Abnormal; Notable for the following components:   WBC 15.2 (*)    Neutro Abs 13.4 (*)    Lymphs Abs 0.9 (*)    All other components within normal limits   ____________________________________________  EKG   ____________________________________________  RADIOLOGY  CT abdomen: 1 mm right ureteral stone with hydronephrosis  ____________________________________________   PROCEDURES  Procedure(s) performed: No  Procedures  Critical Care performed: No ____________________________________________   INITIAL IMPRESSION / ASSESSMENT AND PLAN / ED COURSE  Pertinent labs & imaging results that were available during my care of the patient were reviewed by me and considered in my medical decision making (see chart for details).  37 year old male with PMH as noted above presents with acute onset of right flank pain this morning with one episode of vomiting and with hematuria.  No prior history of kidney stones.  On  exam he is uncomfortable but not acutely ill-appearing, and has no significant abdominal or CVA tenderness.  Overall presentation is consistent with ureteral stone.  Will obtain basic labs, UA, CT stone study, give fluids and analgesia, and reassess.  ----------------------------------------- 9:59 AM on 08/10/2017 -----------------------------------------  CT shows 1 mm right ureteral stone with hydronephrosis.  Patient's pain has been almost completely resolved since he was given Toradol and fluids.  He feels well and would like to go home.  I counseled the patient on the results of the work-up.  There is no indication for emergent urologic consultation at this time.  The patient is stable for discharge home.  Return precautions given, and he expresses understanding.   ____________________________________________   FINAL CLINICAL IMPRESSION(S) / ED DIAGNOSES  Final diagnoses:  Ureteral stone      NEW MEDICATIONS STARTED  DURING THIS VISIT:  New Prescriptions   IBUPROFEN (ADVIL,MOTRIN) 600 MG TABLET    Take 1 tablet (600 mg total) by mouth every 6 (six) hours as needed.     Note:  This document was prepared using Dragon voice recognition software and may include unintentional dictation errors.    Dionne Bucy, MD 08/10/17 1000

## 2017-08-10 NOTE — Discharge Instructions (Addendum)
Take the ibuprofen up to every 6 hours for pain, with the Vicodin that you already have at home as needed for more severe pain.  Return to the ER for new, worsening, persistent severe pain, vomiting, fever, weakness, or any other new or worsening symptoms that concern you.

## 2017-08-10 NOTE — ED Triage Notes (Signed)
Pt via pov from home with RLQ and R Flank pain since 430. Pt also reports nausea and emesis x 1. Pt reports difficulty urinating as well. Pt alert & oriented, some restless noted during triage.

## 2017-08-10 NOTE — ED Notes (Signed)
Patient transported to CT 

## 2017-08-17 ENCOUNTER — Encounter: Payer: Self-pay | Admitting: Nurse Practitioner

## 2017-08-17 ENCOUNTER — Ambulatory Visit (HOSPITAL_BASED_OUTPATIENT_CLINIC_OR_DEPARTMENT_OTHER): Payer: 59 | Admitting: Nurse Practitioner

## 2017-08-17 ENCOUNTER — Ambulatory Visit
Admission: RE | Admit: 2017-08-17 | Discharge: 2017-08-17 | Disposition: A | Discharge status: Home / Self Care | Payer: 59 | Source: Ambulatory Visit | Attending: Nurse Practitioner | Admitting: Nurse Practitioner

## 2017-08-17 ENCOUNTER — Other Ambulatory Visit: Payer: Self-pay

## 2017-08-17 VITALS — BP 148/92 | HR 104 | Temp 97.9°F | Resp 16 | Ht 67.0 in | Wt 214.0 lb

## 2017-08-17 DIAGNOSIS — M50322 Other cervical disc degeneration at C5-C6 level: Secondary | ICD-10-CM | POA: Insufficient documentation

## 2017-08-17 DIAGNOSIS — G894 Chronic pain syndrome: Secondary | ICD-10-CM | POA: Insufficient documentation

## 2017-08-17 DIAGNOSIS — M549 Dorsalgia, unspecified: Secondary | ICD-10-CM | POA: Insufficient documentation

## 2017-08-17 DIAGNOSIS — M546 Pain in thoracic spine: Secondary | ICD-10-CM | POA: Diagnosis not present

## 2017-08-17 DIAGNOSIS — Z79899 Other long term (current) drug therapy: Secondary | ICD-10-CM | POA: Diagnosis not present

## 2017-08-17 DIAGNOSIS — Z789 Other specified health status: Secondary | ICD-10-CM | POA: Diagnosis not present

## 2017-08-17 DIAGNOSIS — G8929 Other chronic pain: Secondary | ICD-10-CM | POA: Insufficient documentation

## 2017-08-17 DIAGNOSIS — M899 Disorder of bone, unspecified: Secondary | ICD-10-CM | POA: Diagnosis not present

## 2017-08-17 DIAGNOSIS — M47812 Spondylosis without myelopathy or radiculopathy, cervical region: Secondary | ICD-10-CM | POA: Diagnosis not present

## 2017-08-17 DIAGNOSIS — Z87891 Personal history of nicotine dependence: Secondary | ICD-10-CM

## 2017-08-17 DIAGNOSIS — M542 Cervicalgia: Secondary | ICD-10-CM | POA: Diagnosis not present

## 2017-08-17 DIAGNOSIS — F1190 Opioid use, unspecified, uncomplicated: Secondary | ICD-10-CM

## 2017-08-17 NOTE — Progress Notes (Signed)
Safety precautions to be maintained throughout the outpatient stay will include: orient to surroundings, keep bed in low position, maintain call bell within reach at all times, provide assistance with transfer out of bed and ambulation.  

## 2017-08-17 NOTE — Patient Instructions (Addendum)
____________________________________________________________________________________________  Appointment Policy Summary  It is our goal and responsibility to provide the medical community with assistance in the evaluation and management of patients with chronic pain. Unfortunately our resources are limited. Because we do not have an unlimited amount of time, or available appointments, we are required to closely monitor and manage their use. The following rules exist to maximize their use:  Patient's responsibilities: 1. Punctuality:  At what time should I arrive? You should be physically present in our office 30 minutes before your scheduled appointment. Your scheduled appointment is with your assigned healthcare provider. However, it takes 5-10 minutes to be "checked-in", and another 15 minutes for the nurses to do the admission. If you arrive to our office at the time you were given for your appointment, you will end up being at least 20-25 minutes late to your appointment with the provider. 2. Tardiness:  What happens if I arrive only a few minutes after my scheduled appointment time? You will need to reschedule your appointment. The cutoff is your appointment time. This is why it is so important that you arrive at least 30 minutes before that appointment. If you have an appointment scheduled for 10:00 AM and you arrive at 10:01, you will be required to reschedule your appointment.  3. Plan ahead:  Always assume that you will encounter traffic on your way in. Plan for it. If you are dependent on a driver, make sure they understand these rules and the need to arrive early. 4. Other appointments and responsibilities:  Avoid scheduling any other appointments before or after your pain clinic appointments.  5. Be prepared:  Write down everything that you need to discuss with your healthcare provider and give this information to the admitting nurse. Write down the medications that you will need  refilled. Bring your pills and bottles (even the empty ones), to all of your appointments, except for those where a procedure is scheduled. 6. No children or pets:  Find someone to take care of them. It is not appropriate to bring them in. 7. Scheduling changes:  We request "advanced notification" of any changes or cancellations. 8. Advanced notification:  Defined as a time period of more than 24 hours prior to the originally scheduled appointment. This allows for the appointment to be offered to other patients. 9. Rescheduling:  When a visit is rescheduled, it will require the cancellation of the original appointment. For this reason they both fall within the category of "Cancellations".  10. Cancellations:  They require advanced notification. Any cancellation less than 24 hours before the  appointment will be recorded as a "No Show". 11. No Show:  Defined as an unkept appointment where the patient failed to notify or declare to the practice their intention or inability to keep the appointment.  Corrective process for repeat offenders:  1. Tardiness: Three (3) episodes of rescheduling due to late arrivals will be recorded as one (1) "No Show". 2. Cancellation or reschedule: Three (3) cancellations or rescheduling will be recorded as one (1) "No Show". 3. "No Shows": Three (3) "No Shows" within a 12 month period will result in discharge from the practice. ____________________________________________________________________________________________  ____________________________________________________________________________________________  Pain Scale  Introduction: The pain score used by this practice is the Verbal Numerical Rating Scale (VNRS-11). This is an 11-point scale. It is for adults and children 10 years or older. There are significant differences in how the pain score is reported, used, and applied. Forget everything you learned in the past and learn  this scoring system.  General  Information: The scale should reflect your current level of pain. Unless you are specifically asked for the level of your worst pain, or your average pain. If you are asked for one of these two, then it should be understood that it is over the past 24 hours.  Basic Activities of Daily Living (ADL): Personal hygiene, dressing, eating, transferring, and using restroom.  Instructions: Most patients tend to report their level of pain as a combination of two factors, their physical pain and their psychosocial pain. This last one is also known as "suffering" and it is reflection of how physical pain affects you socially and psychologically. From now on, report them separately. From this point on, when asked to report your pain level, report only your physical pain. Use the following table for reference.  Pain Clinic Pain Levels (0-5/10)  Pain Level Score  Description  No Pain 0   Mild pain 1 Nagging, annoying, but does not interfere with basic activities of daily living (ADL). Patients are able to eat, bathe, get dressed, toileting (being able to get on and off the toilet and perform personal hygiene functions), transfer (move in and out of bed or a chair without assistance), and maintain continence (able to control bladder and bowel functions). Blood pressure and heart rate are unaffected. A normal heart rate for a healthy adult ranges from 60 to 100 bpm (beats per minute).   Mild to moderate pain 2 Noticeable and distracting. Impossible to hide from other people. More frequent flare-ups. Still possible to adapt and function close to normal. It can be very annoying and may have occasional stronger flare-ups. With discipline, patients may get used to it and adapt.   Moderate pain 3 Interferes significantly with activities of daily living (ADL). It becomes difficult to feed, bathe, get dressed, get on and off the toilet or to perform personal hygiene functions. Difficult to get in and out of bed or a chair  without assistance. Very distracting. With effort, it can be ignored when deeply involved in activities.   Moderately severe pain 4 Impossible to ignore for more than a few minutes. With effort, patients may still be able to manage work or participate in some social activities. Very difficult to concentrate. Signs of autonomic nervous system discharge are evident: dilated pupils (mydriasis); mild sweating (diaphoresis); sleep interference. Heart rate becomes elevated (>115 bpm). Diastolic blood pressure (lower number) rises above 100 mmHg. Patients find relief in laying down and not moving.   Severe pain 5 Intense and extremely unpleasant. Associated with frowning face and frequent crying. Pain overwhelms the senses.  Ability to do any activity or maintain social relationships becomes significantly limited. Conversation becomes difficult. Pacing back and forth is common, as getting into a comfortable position is nearly impossible. Pain wakes you up from deep sleep. Physical signs will be obvious: pupillary dilation; increased sweating; goosebumps; brisk reflexes; cold, clammy hands and feet; nausea, vomiting or dry heaves; loss of appetite; significant sleep disturbance with inability to fall asleep or to remain asleep. When persistent, significant weight loss is observed due to the complete loss of appetite and sleep deprivation.  Blood pressure and heart rate becomes significantly elevated. Caution: If elevated blood pressure triggers a pounding headache associated with blurred vision, then the patient should immediately seek attention at an urgent or emergency care unit, as these may be signs of an impending stroke.    Emergency Department Pain Levels (6-10/10)  Emergency Room Pain 6 Severely   limiting. Requires emergency care and should not be seen or managed at an outpatient pain management facility. Communication becomes difficult and requires great effort. Assistance to reach the emergency department  may be required. Facial flushing and profuse sweating along with potentially dangerous increases in heart rate and blood pressure will be evident.   Distressing pain 7 Self-care is very difficult. Assistance is required to transport, or use restroom. Assistance to reach the emergency department will be required. Tasks requiring coordination, such as bathing and getting dressed become very difficult.   Disabling pain 8 Self-care is no longer possible. At this level, pain is disabling. The individual is unable to do even the most "basic" activities such as walking, eating, bathing, dressing, transferring to a bed, or toileting. Fine motor skills are lost. It is difficult to think clearly.   Incapacitating pain 9 Pain becomes incapacitating. Thought processing is no longer possible. Difficult to remember your own name. Control of movement and coordination are lost.   The worst pain imaginable 10 At this level, most patients pass out from pain. When this level is reached, collapse of the autonomic nervous system occurs, leading to a sudden drop in blood pressure and heart rate. This in turn results in a temporary and dramatic drop in blood flow to the brain, leading to a loss of consciousness. Fainting is one of the body's self defense mechanisms. Passing out puts the brain in a calmed state and causes it to shut down for a while, in order to begin the healing process.    Summary: 1. Refer to this scale when providing us with your pain level. 2. Be accurate and careful when reporting your pain level. This will help with your care. 3. Over-reporting your pain level will lead to loss of credibility. 4. Even a level of 1/10 means that there is pain and will be treated at our facility. 5. High, inaccurate reporting will be documented as "Symptom Exaggeration", leading to loss of credibility and suspicions of possible secondary gains such as obtaining more narcotics, or wanting to appear disabled, for  fraudulent reasons. 6. Only pain levels of 5 or below will be seen at our facility. 7. Pain levels of 6 and above will be sent to the Emergency Department and the appointment cancelled. ____________________________________________________________________________________________    BMI Assessment: Estimated body mass index is 33.52 kg/m as calculated from the following:   Height as of this encounter: 5\' 7"  (1.702 m).   Weight as of this encounter: 214 lb (97.1 kg).  BMI interpretation table: BMI level Category Range association with higher incidence of chronic pain  <18 kg/m2 Underweight   18.5-24.9 kg/m2 Ideal body weight   25-29.9 kg/m2 Overweight Increased incidence by 20%  30-34.9 kg/m2 Obese (Class I) Increased incidence by 68%  35-39.9 kg/m2 Severe obesity (Class II) Increased incidence by 136%  >40 kg/m2 Extreme obesity (Class III) Increased incidence by 254%   BMI Readings from Last 4 Encounters:  08/17/17 33.52 kg/m  08/10/17 33.52 kg/m  08/07/17 33.64 kg/m  07/24/17 33.89 kg/m   Wt Readings from Last 4 Encounters:  08/17/17 214 lb (97.1 kg)  08/10/17 214 lb (97.1 kg)  08/07/17 214 lb 12.8 oz (97.4 kg)  07/24/17 217 lb (98.4 kg)

## 2017-08-17 NOTE — Progress Notes (Signed)
Patient's Name: Shawn Quinn  MRN: [id number]  Referring Provider: Valerie Roys, DO  DOB: [birth year]  PCP: Valerie Roys, DO  DOS: 08/17/2017  Note by: Dionisio David NP  Service setting: Ambulatory outpatient  Specialty: Interventional Pain Management  Location: [facility] (AMB) Pain Management Facility    Patient type: New Patient    Primary Reason(s) for Visit: Initial Patient Evaluation CC: Back Pain (mid and upper, usually behind the shoulder blades)  HPI  Shawn Quinn is a 37 y.o. year old, male patient, who comes today for an initial evaluation. He has Environmental allergies; Chronic midline thoracic back pain; Chronic pain syndrome; Chronic upper back pain; Pharmacologic therapy; Disorder of skeletal system; Problems influencing health status; and Opiate use on their problem list.. His primarily concern today is the Back Pain (mid and upper, usually behind the shoulder blades)  Pain Assessment: Location: Mid, Upper Back Radiating: shoulder blades Onset: More than a month ago Duration: Chronic pain Quality: Constant, Stabbing, Aching Severity: 5 /10 (subjective, self-reported pain score)  Note: Reported level is compatible with observation. Clinically the patient looks like a 0/10  Information on the proper use of the pain scale provided to the patient today.  Timing: Constant Modifying factors: heat, medications BP: (!) 148/92  HR: (!) 104  Onset and Duration: Sudden and Date of onset: 05/2017 Cause of pain: Unknown Severity: Getting better, NAS-11 at its worse: 7/10, NAS-11 at its best: 0/10, NAS-11 now: 3/10 and NAS-11 on the average: 4/10 Timing: Morning and After activity or exercise Aggravating Factors: Bending, Lifiting, Prolonged sitting and Prolonged standing Alleviating Factors: Stretching, Hot packs and Medications Associated Problems: Depression, Fatigue, Spasms, Pain that wakes patient up and Pain that does not allow patient to sleep Quality of Pain: Aching,  Agonizing, Intermittent, Deep, Punishing, Sharp and Stabbing Previous Examinations or Tests: X-rays Previous Treatments: Chiropractic manipulations, Narcotic medications and Steroid treatments by mouth  The patient comes into the clinics today for the first time for a chronic pain management evaluation. According to the patient his primary area of pain is in his middle back.  He admits the pain has been going on for approximately 4 months.  He denies any recent injuries.  He currently works with computers.  He admits that he has a past military history.  He denies any injury.  He admits that the pain is in the middle in between his shoulder blades.  He admits that the pain goes down under the shoulder blades at times.  He denies it being affected by breathing.  He does admit that the pain is worse in the mornings but sometimes vary.  He denies any numbness tingling or sensitivity to the area.  Denies any previous surgery, interventional therapy, physical therapy.  He did have recent images done in April 2019.  He admits that he was given some home stretching exercises but he is not doing those consistently.    He did admit that in the past while in the [place] he was diagnosed with thoracic outlet syndrome secondary to numbness and tingling in his left arm.  He admits that this has resolved he denies any additional upper extremity pain.  He denies any neck pain.  He admits that he did have a nerve conduction study at that time.  Today I took the time to provide the patient with information regarding this pain practice. The patient was informed that the practice is divided into two sections: an interventional pain management section, as well as a completely  separate and distinct medication management section. I explained that there are procedure days for interventional therapies, and evaluation days for follow-ups and medication management. Because of the amount of documentation required during both, they  are kept separated. This means that there is the possibility that he may be scheduled for a procedure on one day, and medication management the next. I have also informed her that because of staffing and facility limitations, this practice will no longer take patients for medication management only. To illustrate the reasons for this, I gave the patient the example of surgeons, and how inappropriate it would be to refer a patient to his/her care, just to write for the post-surgical antibiotics on a surgery done by a different surgeon.   Because interventional pain management is part of the board-certified specialty for the doctors, the patient was informed that joining this practice means that they are open to any and all interventional therapies. I made it clear that this does not mean that they will be forced to have any procedures done. What this means is that I believe interventional therapies to be essential part of the diagnosis and proper management of chronic pain conditions. Therefore, patients not interested in these interventional alternatives will be better served under the care of a different practitioner.  The patient was also made aware of my Comprehensive Pain Management Safety Guidelines where by joining this practice, they limit all of their nerve blocks and joint injections to those done by our practice, for as long as we are retained to manage their care. Historic Controlled Substance Pharmacotherapy Review  PMP and historical list of controlled substances:Hydrocodone/acetaminophen 5/325 mg, tramadol 50 mg, Highest opioid analgesic regimen found: Hydrocodone/acetaminophen 5/325 mg 1 tablet 3 times daily (fill date 08/07/2017) hydrocodone 15 mg/day Most recent opioid analgesic: Hydrocodone/acetaminophen 5/325 mg 1 tablet 3 times daily (fill date 08/07/2017) hydrocodone 15 mg/day Current opioid analgesics: Hydrocodone/acetaminophen 5/325 mg 1 tablet 3 times daily (fill date 08/07/2017)  hydrocodone 15 mg/day Highest recorded MME/day: 15 mg/day MME/day: 15 mg/day Medications: The patient did not bring the medication(s) to the appointment, as requested in our "New Patient Package" Pharmacodynamics: Desired effects: Analgesia: The patient reports >50% benefit. Reported improvement in function: The patient reports medication allows him to accomplish basic ADLs. Clinically meaningful improvement in function (CMIF): Sustained CMIF goals met Perceived effectiveness: Described as relatively effective, allowing for increase in activities of daily living (ADL) Undesirable effects: Side-effects or Adverse reactions: None reported Historical Monitoring: The patient  reports that he does not use drugs. List of all UDS Test(s): No results found for: MDMA, COCAINSCRNUR, PCPSCRNUR, PCPQUANT, CANNABQUANT, THCU, [place] List of all Serum Drug Screening Test(s):  No results found for: AMPHSCRSER, BARBSCRSER, BENZOSCRSER, COCAINSCRSER, PCPSCRSER, PCPQUANT, THCSCRSER, CANNABQUANT, OPIATESCRSER, OXYSCRSER, PROPOXSCRSER Historical Background Evaluation: [place] PDMP: Six (6) year initial data search conducted.             [place] Department of public safety, offender search: Editor, commissioning Information) Non-contributory Risk Assessment Profile: Aberrant behavior: None observed or detected today Risk factors for fatal opioid overdose: None identified today Fatal overdose hazard ratio (HR): Calculation deferred Non-fatal overdose hazard ratio (HR): Calculation deferred Risk of opioid abuse or dependence: 0.7-3.0% with doses ? 36 MME/day and 6.1-26% with doses ? 120 MME/day. Substance use disorder (SUD) risk level: Pending results of Medical Psychology Evaluation for SUD Opioid risk tool (ORT) (Total Score): 5  ORT Scoring interpretation table:  Score <3 = Low Risk for SUD  Score between 4-7 = Moderate Risk  for SUD  Score >8 = High Risk for Opioid Abuse   PHQ-2 Depression Scale:  Total score: 0  PHQ-2 Scoring  interpretation table: (Score and probability of major depressive disorder)  Score 0 = No depression  Score 1 = 15.4% Probability  Score 2 = 21.1% Probability  Score 3 = 38.4% Probability  Score 4 = 45.5% Probability  Score 5 = 56.4% Probability  Score 6 = 78.6% Probability   PHQ-9 Depression Scale:  Total score: 0  PHQ-9 Scoring interpretation table:  Score 0-4 = No depression  Score 5-9 = Mild depression  Score 10-14 = Moderate depression  Score 15-19 = Moderately severe depression  Score 20-27 = Severe depression (2.4 times higher risk of SUD and 2.89 times higher risk of overuse)   Pharmacologic Plan: Pending ordered tests and/or consults  Meds  The patient has a current medication list which includes the following prescription(s): fexofenadine, fluticasone, gabapentin, hydrocodone-acetaminophen, ibuprofen, and tizanidine.  Current Outpatient Medications on File Prior to Visit  Medication Sig  . fexofenadine (ALLEGRA ALLERGY) 180 MG tablet Take 1 tablet (180 mg total) by mouth daily.  . fluticasone (FLONASE) 50 MCG/ACT nasal spray Place 2 sprays into both nostrils daily.  Marland Kitchen gabapentin (NEURONTIN) 100 MG capsule Take 1 at bedtime for 1 week, then 1 BID for 1 week, then 1 TID for 1 week. (Patient taking differently: Take 100 mg by mouth as needed. Take 1 at bedtime for 1 week, then 1 BID for 1 week, then 1 TID for 1 week.)  . HYDROcodone-acetaminophen (NORCO/VICODIN) 5-325 MG tablet Take 0.5-1 tablets by mouth every 8 (eight) hours as needed for moderate pain.  Marland Kitchen ibuprofen (ADVIL,MOTRIN) 600 MG tablet Take 1 tablet (600 mg total) by mouth every 6 (six) hours as needed.  Marland Kitchen tiZANidine (ZANAFLEX) 4 MG capsule Take 1 capsule (4 mg total) by mouth 3 (three) times daily. (Patient taking differently: Take 4 mg by mouth at bedtime as needed. )   No current facility-administered medications on file prior to visit.    Imaging Review  Thoracic Imaging:  Thoracic DG w/swimmers view:   Results for orders placed during the hospital encounter of 05/02/17  DG Thoracic Spine W/Swimmers   Narrative EXAM: THORACIC SPINE - 3 VIEWS  COMPARISON:  None.  FINDINGS: There is no evidence of thoracic spine fracture. Alignment is normal. No other significant bone abnormalities are identified.  IMPRESSION: Negative.   Electronically Signed   By: Kathreen Devoid   On: 05/02/2017 15:57   Note: Available results from prior imaging studies were reviewed.        ROS  Cardiovascular History: No reported cardiovascular signs or symptoms such as High blood pressure, coronary artery disease, abnormal heart rate or rhythm, heart attack, blood thinner therapy or heart weakness and/or failure Pulmonary or Respiratory History: Snoring  and Temporary stoppage of breathing during sleep Neurological History: No reported neurological signs or symptoms such as seizures, abnormal skin sensations, urinary and/or fecal incontinence, being born with an abnormal open spine and/or a tethered spinal cord Review of Past Neurological Studies: No results found for this or any previous visit. Psychological-Psychiatric History: Anxiousness, Depressed, Prone to panicking and Difficulty sleeping and or falling asleep Gastrointestinal History: No reported gastrointestinal signs or symptoms such as vomiting or evacuating blood, reflux, heartburn, alternating episodes of diarrhea and constipation, inflamed or scarred liver, or pancreas or irrregular and/or infrequent bowel movements Genitourinary History: Passing kidney stones Hematological History: No reported hematological signs or symptoms such as  prolonged bleeding, low or poor functioning platelets, bruising or bleeding easily, hereditary bleeding problems, low energy levels due to low hemoglobin or being anemic Endocrine History: No reported endocrine signs or symptoms such as high or low blood sugar, rapid heart rate due to high thyroid levels, obesity or weight  gain due to slow thyroid or thyroid disease Rheumatologic History: No reported rheumatological signs and symptoms such as fatigue, joint pain, tenderness, swelling, redness, heat, stiffness, decreased range of motion, with or without associated rash Musculoskeletal History: Negative for myasthenia gravis, muscular dystrophy, multiple sclerosis or malignant hyperthermia Work History: Working full time  Allergies  Mr. Stines has No Known Allergies.  Laboratory Chemistry  Inflammation Markers No results found for: CRP, ESRSEDRATE (CRP: Acute Phase) (ESR: Chronic Phase) Renal Function Markers Lab Results  Component Value Date   BUN 11 08/10/2017   CREATININE 1.19 08/10/2017   GFRAA >60 08/10/2017   GFRNONAA >60 08/10/2017   Hepatic Function Markers No results found for: AST, ALT, ALBUMIN, ALKPHOS, HCVAB Electrolytes Lab Results  Component Value Date   NA 137 08/10/2017   K 3.4 (L) 08/10/2017   CL 104 08/10/2017   CALCIUM 8.8 (L) 08/10/2017   Neuropathy Markers No results found for: [id number] Bone Pathology Markers Lab Results  Component Value Date   CALCIUM 8.8 (L) 08/10/2017   Coagulation Parameters Lab Results  Component Value Date   PLT 334 08/10/2017   Cardiovascular Markers Lab Results  Component Value Date   HGB 15.5 08/10/2017   HCT 44.9 08/10/2017   Note: Lab results reviewed.  PFSH  Drug: Mr. Hollomon  reports that he does not use drugs. Alcohol:  reports that he drinks alcohol. Tobacco:  reports that he has quit smoking. His smoking use included cigarettes. He has quit using smokeless tobacco. His smokeless tobacco use included chew. Medical:  has a past medical history of Anxiety and Depression. Family: family history includes Cancer in his maternal grandmother; Drug abuse in his father; Multiple sclerosis in his father.  Past Surgical History:  Procedure Laterality Date  . WISDOM TOOTH EXTRACTION  37yo   37yo  . WISDOM TOOTH EXTRACTION Bilateral  2002   Active Ambulatory Problems    Diagnosis Date Noted  . Environmental allergies 05/01/2017  . Chronic midline thoracic back pain 06/07/2017  . Chronic pain syndrome 08/17/2017  . Chronic upper back pain 08/17/2017  . Pharmacologic therapy 08/17/2017  . Disorder of skeletal system 08/17/2017  . Problems influencing health status 08/17/2017  . Opiate use 08/17/2017   Resolved Ambulatory Problems    Diagnosis Date Noted  . No Resolved Ambulatory Problems   Past Medical History:  Diagnosis Date  . Anxiety   . Depression    Constitutional Exam  General appearance: Well nourished, well developed, and well hydrated. In no apparent acute distress Vitals:   08/17/17 0800  BP: (!) 148/92  Pulse: (!) 104  Resp: 16  Temp: 97.9 F (36.6 C)  TempSrc: Oral  SpO2: 99%  Weight: 214 lb (97.1 kg)  Height: 5' 7"  (1.702 m)   BMI Assessment: Estimated body mass index is 33.52 kg/m as calculated from the following:   Height as of this encounter: 5' 7"  (1.702 m).   Weight as of this encounter: 214 lb (97.1 kg).  BMI interpretation table: BMI level Category Range association with higher incidence of chronic pain  <18 kg/m2 Underweight   18.5-24.9 kg/m2 Ideal body weight   25-29.9 kg/m2 Overweight Increased incidence by 20%  30-34.9 kg/m2 Obese (  Class I) Increased incidence by 68%  35-39.9 kg/m2 Severe obesity (Class II) Increased incidence by 136%  >40 kg/m2 Extreme obesity (Class III) Increased incidence by 254%   BMI Readings from Last 4 Encounters:  08/17/17 33.52 kg/m  08/10/17 33.52 kg/m  08/07/17 33.64 kg/m  07/24/17 33.89 kg/m   Wt Readings from Last 4 Encounters:  08/17/17 214 lb (97.1 kg)  08/10/17 214 lb (97.1 kg)  08/07/17 214 lb 12.8 oz (97.4 kg)  07/24/17 217 lb (98.4 kg)  Psych/Mental status: Alert, oriented x 3 (person, place, & time)       Eyes: PERLA Respiratory: No evidence of acute respiratory distress  Cervical Spine Exam  Inspection: No masses,  redness, or swelling Alignment: Symmetrical Functional ROM: Unrestricted ROM      Stability: No instability detected Muscle strength & Tone: Functionally intact Sensory: Unimpaired Palpation: No palpable anomalies              Upper Extremity (UE) Exam    Side: Right upper extremity  Side: Left upper extremity  Inspection: No masses, redness, swelling, or asymmetry. No contractures  Inspection: No masses, redness, swelling, or asymmetry. No contractures  Functional ROM: Unrestricted ROM          Functional ROM: Unrestricted ROM          Muscle strength & Tone: Functionally intact  Muscle strength & Tone: Functionally intact  Sensory: Unimpaired  Sensory: Unimpaired  Palpation: No palpable anomalies              Palpation: No palpable anomalies              Specialized Test(s): Deferred         Specialized Test(s): Deferred          Thoracic Spine Exam  Inspection: No masses, redness, or swelling Alignment: Symmetrical Functional ROM: Unrestricted ROM Stability: No instability detected Sensory: Unimpaired Muscle strength & Tone: Non-tender  Lumbar Spine Exam  Inspection: No masses, redness, or swelling Alignment: Symmetrical Functional ROM: Unrestricted ROM      Stability: No instability detected Muscle strength & Tone: Functionally intact Sensory: Unimpaired Palpation: No palpable anomalies       Provocative Tests: Lumbar Hyperextension and rotation test: evaluation deferred today       Patrick's Maneuver: evaluation deferred today                    Gait & Posture Assessment  Ambulation: Unassisted Gait: Relatively normal for age and body habitus Posture: WNL   Lower Extremity Exam    Side: Right lower extremity  Side: Left lower extremity  Inspection: No masses, redness, swelling, or asymmetry. No contractures  Inspection: No masses, redness, swelling, or asymmetry. No contractures  Functional ROM: Unrestricted ROM          Functional ROM: Unrestricted ROM           Muscle strength & Tone: Functionally intact  Muscle strength & Tone: Functionally intact  Sensory: Unimpaired  Sensory: Unimpaired  Palpation: No palpable anomalies  Palpation: No palpable anomalies   Assessment  Primary Diagnosis & Pertinent Problem List: The primary encounter diagnosis was Chronic midline thoracic back pain. Diagnoses of Chronic upper back pain, Chronic pain syndrome, Pharmacologic therapy, Disorder of skeletal system, Problems influencing health status, and Opiate use were also pertinent to this visit.  Visit Diagnosis: 1. Chronic midline thoracic back pain   2. Chronic upper back pain   3. Chronic pain syndrome   4. Pharmacologic  therapy   5. Disorder of skeletal system   6. Problems influencing health status   7. Opiate use    Plan of Care  Initial treatment plan:  Please be advised that as per protocol, today's visit has been an evaluation only. We have not taken over the patient's controlled substance management.  Problem-specific plan: No problem-specific Assessment & Plan notes found for this encounter.  Ordered Lab-work, Procedure(s), Referral(s), & Consult(s): Orders Placed This Encounter  Procedures  . DG Cervical Spine With Flex & Extend  . Compliance Drug Analysis, Ur  . Comp. Metabolic Panel (12)  . Magnesium  . Vitamin B12  . Sedimentation rate  . 25-Hydroxyvitamin D Lcms D2+D3  . C-reactive protein   Pharmacotherapy: Medications ordered:  No orders of the defined types were placed in this encounter.  Medications administered during this visit: Gurjot Brisco had no medications administered during this visit.   Pharmacotherapy under consideration:  Opioid Analgesics: The patient was informed that there is no guarantee that he would be a candidate for opioid analgesics. The decision will be made following CDC guidelines. This decision will be based on the results of diagnostic studies, as well as Mr. Abercrombie's risk profile.  Membrane  stabilizer: To be determined at a later time Muscle relaxant: To be determined at a later time NSAID: To be determined at a later time Other analgesic(s): To be determined at a later time   Interventional therapies under consideration: Mr. Alves was informed that there is no guarantee that he would be a candidate for interventional therapies. The decision will be based on the results of diagnostic studies, as well as Mr. Hashimi's risk profile.  Possible procedure(s): Diagnostic trigger point injections Diagnostic midline thoracic epidural steroid injection Diagnostic midline thoracic lumbar facet nerve block   Provider-requested follow-up: No follow-ups on file.  Future Appointments  Date Time Provider [place]  08/25/2017 11:00 AM Valerie Roys, DO [facility]  09/04/2017  8:30 AM Milinda Pointer, MD [facility] None    Primary Care Physician: Valerie Roys, DO Location: [facility] Note by:  Date: 08/17/2017; Time: 12:03 PM  Pain Score Disclaimer: We use the NRS-11 scale. This is a self-reported, subjective measurement of pain severity with only modest accuracy. It is used primarily to identify changes within a particular patient. It must be understood that outpatient pain scales are significantly less accurate that those used for research, where they can be applied under ideal controlled circumstances with minimal exposure to variables. In reality, the score is likely to be a combination of pain intensity and pain affect, where pain affect describes the degree of emotional arousal or changes in action readiness caused by the sensory experience of pain. Factors such as social and work situation, setting, emotional state, anxiety levels, expectation, and prior pain experience may influence pain perception and show large inter-individual differences that may also be affected by time variables.  Patient instructions provided during this  appointment: Patient Instructions   ____________________________________________________________________________________________  Appointment Policy Summary  It is our goal and responsibility to provide the medical community with assistance in the evaluation and management of patients with chronic pain. Unfortunately our resources are limited. Because we do not have an unlimited amount of time, or available appointments, we are required to closely monitor and manage their use. The following rules exist to maximize their use:  Patient's responsibilities: 1. Punctuality:  At what time should I arrive? You should be physically present in our office 30  minutes before your scheduled appointment. Your scheduled appointment is with your assigned healthcare provider. However, it takes 5-10 minutes to be "checked-in", and another 15 minutes for the nurses to do the admission. If you arrive to our office at the time you were given for your appointment, you will end up being at least 20-25 minutes late to your appointment with the provider. 2. Tardiness:  What happens if I arrive only a few minutes after my scheduled appointment time? You will need to reschedule your appointment. The cutoff is your appointment time. This is why it is so important that you arrive at least 30 minutes before that appointment. If you have an appointment scheduled for 10:00 AM and you arrive at 10:01, you will be required to reschedule your appointment.  3. Plan ahead:  Always assume that you will encounter traffic on your way in. Plan for it. If you are dependent on a driver, make sure they understand these rules and the need to arrive early. 4. Other appointments and responsibilities:  Avoid scheduling any other appointments before or after your pain clinic appointments.  5. Be prepared:  Write down everything that you need to discuss with your healthcare provider and give this information to the admitting nurse. Write down the  medications that you will need refilled. Bring your pills and bottles (even the empty ones), to all of your appointments, except for those where a procedure is scheduled. 6. No children or pets:  Find someone to take care of them. It is not appropriate to bring them in. 7. Scheduling changes:  We request "advanced notification" of any changes or cancellations. 8. Advanced notification:  Defined as a time period of more than 24 hours prior to the originally scheduled appointment. This allows for the appointment to be offered to other patients. 9. Rescheduling:  When a visit is rescheduled, it will require the cancellation of the original appointment. For this reason they both fall within the category of "Cancellations".  10. Cancellations:  They require advanced notification. Any cancellation less than 24 hours before the  appointment will be recorded as a "No Show". 11. No Show:  Defined as an unkept appointment where the patient failed to notify or declare to the practice their intention or inability to keep the appointment.  Corrective process for repeat offenders:  1. Tardiness: Three (3) episodes of rescheduling due to late arrivals will be recorded as one (1) "No Show". 2. Cancellation or reschedule: Three (3) cancellations or rescheduling will be recorded as one (1) "No Show". 3. "No Shows": Three (3) "No Shows" within a 12 month period will result in discharge from the practice. ____________________________________________________________________________________________  ____________________________________________________________________________________________  Pain Scale  Introduction: The pain score used by this practice is the Verbal Numerical Rating Scale (VNRS-11). This is an 11-point scale. It is for adults and children 10 years or older. There are significant differences in how the pain score is reported, used, and applied. Forget everything you learned in the past and learn  this scoring system.  General Information: The scale should reflect your current level of pain. Unless you are specifically asked for the level of your worst pain, or your average pain. If you are asked for one of these two, then it should be understood that it is over the past 24 hours.  Basic Activities of Daily Living (ADL): Personal hygiene, dressing, eating, transferring, and using restroom.  Instructions: Most patients tend to report their level of pain as a combination of two factors,  their physical pain and their psychosocial pain. This last one is also known as "suffering" and it is reflection of how physical pain affects you socially and psychologically. From now on, report them separately. From this point on, when asked to report your pain level, report only your physical pain. Use the following table for reference.  Pain Clinic Pain Levels (0-5/10)  Pain Level Score  Description  No Pain 0   Mild pain 1 Nagging, annoying, but does not interfere with basic activities of daily living (ADL). Patients are able to eat, bathe, get dressed, toileting (being able to get on and off the toilet and perform personal hygiene functions), transfer (move in and out of bed or a chair without assistance), and maintain continence (able to control bladder and bowel functions). Blood pressure and heart rate are unaffected. A normal heart rate for a healthy adult ranges from 60 to 100 bpm (beats per minute).   Mild to moderate pain 2 Noticeable and distracting. Impossible to hide from other people. More frequent flare-ups. Still possible to adapt and function close to normal. It can be very annoying and may have occasional stronger flare-ups. With discipline, patients may get used to it and adapt.   Moderate pain 3 Interferes significantly with activities of daily living (ADL). It becomes difficult to feed, bathe, get dressed, get on and off the toilet or to perform personal hygiene functions. Difficult to get  in and out of bed or a chair without assistance. Very distracting. With effort, it can be ignored when deeply involved in activities.   Moderately severe pain 4 Impossible to ignore for more than a few minutes. With effort, patients may still be able to manage work or participate in some social activities. Very difficult to concentrate. Signs of autonomic nervous system discharge are evident: dilated pupils (mydriasis); mild sweating (diaphoresis); sleep interference. Heart rate becomes elevated (>115 bpm). Diastolic blood pressure (lower number) rises above 100 mmHg. Patients find relief in laying down and not moving.   Severe pain 5 Intense and extremely unpleasant. Associated with frowning face and frequent crying. Pain overwhelms the senses.  Ability to do any activity or maintain social relationships becomes significantly limited. Conversation becomes difficult. Pacing back and forth is common, as getting into a comfortable position is nearly impossible. Pain wakes you up from deep sleep. Physical signs will be obvious: pupillary dilation; increased sweating; goosebumps; brisk reflexes; cold, clammy hands and feet; nausea, vomiting or dry heaves; loss of appetite; significant sleep disturbance with inability to fall asleep or to remain asleep. When persistent, significant weight loss is observed due to the complete loss of appetite and sleep deprivation.  Blood pressure and heart rate becomes significantly elevated. Caution: If elevated blood pressure triggers a pounding headache associated with blurred vision, then the patient should immediately seek attention at an urgent or emergency care unit, as these may be signs of an impending stroke.    Emergency Department Pain Levels (6-10/10)  Emergency Room Pain 6 Severely limiting. Requires emergency care and should not be seen or managed at an outpatient pain management facility. Communication becomes difficult and requires great effort. Assistance to  reach the emergency department may be required. Facial flushing and profuse sweating along with potentially dangerous increases in heart rate and blood pressure will be evident.   Distressing pain 7 Self-care is very difficult. Assistance is required to transport, or use restroom. Assistance to reach the emergency department will be required. Tasks requiring coordination, such as bathing and  getting dressed become very difficult.   Disabling pain 8 Self-care is no longer possible. At this level, pain is disabling. The individual is unable to do even the most "basic" activities such as walking, eating, bathing, dressing, transferring to a bed, or toileting. Fine motor skills are lost. It is difficult to think clearly.   Incapacitating pain 9 Pain becomes incapacitating. Thought processing is no longer possible. Difficult to remember your own name. Control of movement and coordination are lost.   The worst pain imaginable 10 At this level, most patients pass out from pain. When this level is reached, collapse of the autonomic nervous system occurs, leading to a sudden drop in blood pressure and heart rate. This in turn results in a temporary and dramatic drop in blood flow to the brain, leading to a loss of consciousness. Fainting is one of the body's self defense mechanisms. Passing out puts the brain in a calmed state and causes it to shut down for a while, in order to begin the healing process.    Summary: 1. Refer to this scale when providing [place] with your pain level. 2. Be accurate and careful when reporting your pain level. This will help with your care. 3. Over-reporting your pain level will lead to loss of credibility. 4. Even a level of 1/10 means that there is pain and will be treated at our facility. 5. High, inaccurate reporting will be documented as "Symptom Exaggeration", leading to loss of credibility and suspicions of possible secondary gains such as obtaining more narcotics, or wanting  to appear disabled, for fraudulent reasons. 6. Only pain levels of 5 or below will be seen at our facility. 7. Pain levels of 6 and above will be sent to the Emergency Department and the appointment cancelled. ____________________________________________________________________________________________    BMI Assessment: Estimated body mass index is 33.52 kg/m as calculated from the following:   Height as of this encounter: 5' 7"  (1.702 m).   Weight as of this encounter: 214 lb (97.1 kg).  BMI interpretation table: BMI level Category Range association with higher incidence of chronic pain  <18 kg/m2 Underweight   18.5-24.9 kg/m2 Ideal body weight   25-29.9 kg/m2 Overweight Increased incidence by 20%  30-34.9 kg/m2 Obese (Class I) Increased incidence by 68%  35-39.9 kg/m2 Severe obesity (Class II) Increased incidence by 136%  >40 kg/m2 Extreme obesity (Class III) Increased incidence by 254%   BMI Readings from Last 4 Encounters:  08/17/17 33.52 kg/m  08/10/17 33.52 kg/m  08/07/17 33.64 kg/m  07/24/17 33.89 kg/m   Wt Readings from Last 4 Encounters:  08/17/17 214 lb (97.1 kg)  08/10/17 214 lb (97.1 kg)  08/07/17 214 lb 12.8 oz (97.4 kg)  07/24/17 217 lb (98.4 kg)

## 2017-08-21 NOTE — Progress Notes (Signed)
Results were reviewed and found to be: mildly abnormal  No acute injury or pathology identified  Review would suggest interventional pain management techniques may be of benefit 

## 2017-08-22 LAB — COMP. METABOLIC PANEL (12)
A/G RATIO: 1.6 (ref 1.2–2.2)
AST: 14 IU/L (ref 0–40)
Albumin: 4.4 g/dL (ref 3.5–5.5)
Alkaline Phosphatase: 102 IU/L (ref 39–117)
BUN / CREAT RATIO: 8 — AB (ref 9–20)
BUN: 9 mg/dL (ref 6–20)
Bilirubin Total: 0.4 mg/dL (ref 0.0–1.2)
CHLORIDE: 101 mmol/L (ref 96–106)
Calcium: 9.5 mg/dL (ref 8.7–10.2)
Creatinine, Ser: 1.19 mg/dL (ref 0.76–1.27)
GFR calc Af Amer: 91 mL/min/{1.73_m2} (ref >59)
GFR calc non Af Amer: 79 mL/min/{1.73_m2} (ref >59)
Globulin, Total: 2.8 g/dL (ref 1.5–4.5)
Glucose: 84 mg/dL (ref 65–99)
Potassium: 4.2 mmol/L (ref 3.5–5.2)
Sodium: 141 mmol/L (ref 134–144)
Total Protein: 7.2 g/dL (ref 6.0–8.5)

## 2017-08-22 LAB — SEDIMENTATION RATE: Sed Rate: 29 mm/hr — ABNORMAL HIGH (ref 0–15)

## 2017-08-22 LAB — COMPLIANCE DRUG ANALYSIS, UR

## 2017-08-22 LAB — 25-HYDROXYVITAMIN D LCMS D2+D3: 25-HYDROXY, VITAMIN D: 12 ng/mL — AB

## 2017-08-22 LAB — 25-HYDROXY VITAMIN D LCMS D2+D3
25-Hydroxy, Vitamin D-2: <1 ng/mL
25-Hydroxy, Vitamin D-3: 12 ng/mL

## 2017-08-22 LAB — MAGNESIUM: MAGNESIUM: 2.1 mg/dL (ref 1.6–2.3)

## 2017-08-22 LAB — C-REACTIVE PROTEIN: CRP: 11 mg/L — ABNORMAL HIGH (ref 0–10)

## 2017-08-22 LAB — VITAMIN B12: Vitamin B-12: 281 pg/mL (ref 232–1245)

## 2017-08-23 DIAGNOSIS — G4733 Obstructive sleep apnea (adult) (pediatric): Secondary | ICD-10-CM | POA: Diagnosis not present

## 2017-08-25 ENCOUNTER — Encounter: Payer: Self-pay | Admitting: Family Medicine

## 2017-08-25 ENCOUNTER — Ambulatory Visit (INDEPENDENT_AMBULATORY_CARE_PROVIDER_SITE_OTHER): Payer: 59 | Admitting: Family Medicine

## 2017-08-25 ENCOUNTER — Other Ambulatory Visit: Payer: Self-pay

## 2017-08-25 VITALS — BP 140/99 | HR 85 | Temp 98.3°F | Ht 67.0 in | Wt 212.4 lb

## 2017-08-25 DIAGNOSIS — Z79899 Other long term (current) drug therapy: Secondary | ICD-10-CM | POA: Diagnosis not present

## 2017-08-25 DIAGNOSIS — G8929 Other chronic pain: Secondary | ICD-10-CM

## 2017-08-25 DIAGNOSIS — M546 Pain in thoracic spine: Secondary | ICD-10-CM | POA: Diagnosis not present

## 2017-08-25 MED ORDER — HYDROCODONE-ACETAMINOPHEN 5-325 MG PO TABS: 0.5000 | ORAL_TABLET | Freq: Every day | ORAL | 0 refills | Status: DC

## 2017-08-25 NOTE — Progress Notes (Signed)
BP (!) 140/99   Pulse 85   Temp 98.3 F (36.8 C) (Oral)   Ht 5\' 7"  (1.702 m)   Wt 212 lb 6.4 oz (96.3 kg)   SpO2 98%   BMI 33.27 kg/m    Subjective:    Patient ID: Shawn Quinn Copher, male    DOB: 10/03/1980, 37 y.o.   MRN: [id number]  HPI: Shawn Quinn Cervantez is a 37 y.o. male  Chief Complaint  Patient presents with  . Follow-up    pt states he went to the hospital 2 weeks ago for kidney stones symptoms   CHRONIC PAIN- sleeping on the board is no longer helping. Has been having quite a bit of pain, has only been taking his medicine at bedtime because it makes him feel groggy when he is awake. Viocodin is helping more than the tramadol was Present dose: 7.5-15 Morphine equivalents Pain control status: uncontrolled Duration: chronic Location: upper back on the L Quality: sharp and aching Current Pain Level: mild Previous Pain Level: severe Breakthrough pain: yes Benefit from narcotic medications: yes What Activities task can be accomplished with current medication?- able to sleep 1-2 more hours with the medicine  Interested in weaning off narcotics:yes- but isn't doing well off it   Stool softners/OTC fiber: no  Previous pain specialty evaluation: yes Non-narcotic analgesic meds: yes Narcotic contract: yes  Relevant past medical, surgical, family and social history reviewed and updated as indicated. Interim medical history since our last visit reviewed. Allergies and medications reviewed and updated.  Review of Systems  Constitutional: Negative.   Respiratory: Negative.   Cardiovascular: Negative.   Musculoskeletal: Positive for back pain and myalgias. Negative for arthralgias, gait problem, joint swelling, neck pain and neck stiffness.  Skin: Negative.   Neurological: Negative.   Psychiatric/Behavioral: Negative.     Per HPI unless specifically indicated above     Objective:    BP (!) 140/99   Pulse 85   Temp 98.3 F (36.8 C) (Oral)   Ht 5\' 7"  (1.702 m)   Wt 212 lb  6.4 oz (96.3 kg)   SpO2 98%   BMI 33.27 kg/m   Wt Readings from Last 3 Encounters:  08/25/17 212 lb 6.4 oz (96.3 kg)  08/17/17 214 lb (97.1 kg)  08/10/17 214 lb (97.1 kg)    Physical Exam  Constitutional: He is oriented to person, place, and time. He appears well-developed and well-nourished. No distress.  HENT:  Head: Normocephalic and atraumatic.  Right Ear: Hearing normal.  Left Ear: Hearing normal.  Nose: Nose normal.  Eyes: Conjunctivae and lids are normal. Right eye exhibits no discharge. Left eye exhibits no discharge. No scleral icterus.  Cardiovascular: Normal rate, regular rhythm, normal heart sounds and intact distal pulses. Exam reveals no gallop and no friction rub.  No murmur heard. Pulmonary/Chest: Effort normal and breath sounds normal. No stridor. No respiratory distress. He has no wheezes. He has no rales. He exhibits no tenderness.  Musculoskeletal: Normal range of motion.  Neurological: He is alert and oriented to person, place, and time.  Skin: Skin is warm, dry and intact. Capillary refill takes less than 2 seconds. No rash noted. He is not diaphoretic. No erythema. No pallor.  Psychiatric: He has a normal mood and affect. His speech is normal and behavior is normal. Judgment and thought content normal. Cognition and memory are normal.    Results for orders placed or performed in visit on 08/17/17  Compliance Drug Analysis, Ur  Result Value Ref Range  Summary FINAL   Comp. Metabolic Panel (12)  Result Value Ref Range   Glucose 84 65 - 99 mg/dL   BUN 9 6 - 20 mg/dL   Creatinine, Ser 1.61 0.76 - 1.27 mg/dL   GFR calc non Af Amer 79 >59 mL/min/1.73   GFR calc Af Amer 91 >59 mL/min/1.73   BUN/Creatinine Ratio 8 (L) 9 - 20   Sodium 141 134 - 144 mmol/L   Potassium 4.2 3.5 - 5.2 mmol/L   Chloride 101 96 - 106 mmol/L   Calcium 9.5 8.7 - 10.2 mg/dL   Total Protein 7.2 6.0 - 8.5 g/dL   Albumin 4.4 3.5 - 5.5 g/dL   Globulin, Total 2.8 1.5 - 4.5 g/dL    Albumin/Globulin Ratio 1.6 1.2 - 2.2   Bilirubin Total 0.4 0.0 - 1.2 mg/dL   Alkaline Phosphatase 102 39 - 117 IU/L   AST 14 0 - 40 IU/L  Magnesium  Result Value Ref Range   Magnesium 2.1 1.6 - 2.3 mg/dL  Vitamin W96  Result Value Ref Range   Vitamin B-12 281 232 - 1,245 pg/mL  Sedimentation rate  Result Value Ref Range   Sed Rate 29 (H) 0 - 15 mm/hr  25-Hydroxyvitamin D Lcms D2+D3  Result Value Ref Range   25-Hydroxy, Vitamin D 12 (L) ng/mL   25-Hydroxy, Vitamin D-2 <1.0 ng/mL   25-Hydroxy, Vitamin D-3 12 ng/mL  C-reactive protein  Result Value Ref Range   CRP 11 (H) 0 - 10 mg/L      Assessment & Plan:   Problem List Items Addressed This Visit      Other   Chronic midline thoracic back pain - Primary    Following up with pain management. Medicine is helping. Will continue it at night. Rx given today. Call with any concerns.       Relevant Medications   HYDROcodone-acetaminophen (NORCO/VICODIN) 5-325 MG tablet   Controlled substance agreement signed    For vicodin. See scanned document.           Follow up plan: Return in about 3 months (around 11/25/2017).

## 2017-08-25 NOTE — Assessment & Plan Note (Signed)
For vicodin. See scanned document.

## 2017-08-25 NOTE — Assessment & Plan Note (Signed)
Following up with pain management. Medicine is helping. Will continue it at night. Rx given today. Call with any concerns.

## 2017-09-01 NOTE — Progress Notes (Signed)
Patient's Name: Shawn Quinn  MRN: [id number]  Referring Provider: Valerie Roys, DO  DOB: Oct 07, 1980  PCP: Valerie Roys, DO  DOS: 09/04/2017  Note by: Gaspar Cola, MD  Service setting: Ambulatory outpatient  Specialty: Interventional Pain Management  Location: [facility] (AMB) Pain Management Facility    Patient type: Established   Primary Reason(s) for Visit: Encounter for evaluation before starting new chronic pain management plan of care (Level of risk: moderate) CC: Back Pain (upper, between shoulder blades)  HPI  Shawn Quinn is a 37 y.o. year old, male patient, who comes today for a follow-up evaluation to review the test results and decide on a treatment plan. He has Environmental allergies; Chronic thoracic back pain (Midline); Chronic pain syndrome; Chronic upper back pain (Midline); Pharmacologic therapy; Disorder of skeletal system; Problems influencing health status; Opiate use; Controlled substance agreement signed; DDD (degenerative disc disease), cervical; Rhomboid muscle pain; Vitamin D deficiency; and Cervical radiculitis (C5) (Left) on their problem list. His primarily concern today is the Back Pain (upper, between shoulder blades)  Pain Assessment: Location: Upper Back Radiating: bilateral shoulder blades Onset: More than a month ago Duration: Chronic pain Quality: Aching, Constant, Stabbing Severity: 2 /10 (subjective, self-reported pain score)  Note: Reported level is compatible with observation.                         When using our objective Pain Scale, levels between 6 and 10/10 are said to belong in an emergency room, as it progressively worsens from a 6/10, described as severely limiting, requiring emergency care not usually available at an outpatient pain management facility. At a 6/10 level, communication becomes difficult and requires great effort. Assistance to reach the emergency department may be required. Facial flushing and profuse sweating along with  potentially dangerous increases in heart rate and blood pressure will be evident. Timing: Constant Modifying factors: heat, medications BP: (!) 152/95  HR: (!) 103  Shawn Quinn comes in today for a follow-up visit after his initial evaluation on 08/17/2017. Today we went over the results of his tests. These were explained in "Layman's terms". During today's appointment we went over my diagnostic impression, as well as the proposed treatment plan.  According to the patient his primary area of pain is in his middle back.  He admits the pain has been going on for approximately 4 months.  He denies any recent injuries.  He currently works with computers.  He admits that he has a past military history.  He denies any injury.  He admits that the pain is in the middle in between his shoulder blades.  He admits that the pain goes down under the shoulder blades at times.  He denies it being affected by breathing.  He does admit that the pain is worse in the mornings but sometimes vary.  He denies any numbness tingling or sensitivity to the area.  Denies any previous surgery, interventional therapy, physical therapy.  He did have recent images done in April 2019.  He admits that he was given some home stretching exercises but he is not doing those consistently.    He did admit that in the past while in the [place] he was diagnosed with thoracic outlet syndrome secondary to numbness and tingling in his left arm.  He admits that this has resolved he denies any additional upper extremity pain.  He denies any neck pain.  He admits that he did have a nerve conduction  study at that time.  In considering the treatment plan options, Shawn Quinn was reminded that I no longer take patients for medication management only. I asked him to let me know if he had no intention of taking advantage of the interventional therapies, so that we could make arrangements to provide this space to someone interested. I also made it clear  that undergoing interventional therapies for the purpose of getting pain medications is very inappropriate on the part of a patient, and it will not be tolerated in this practice. This type of behavior would suggest true addiction and therefore it requires referral to an addiction specialist.   Further details on both, my assessment(s), as well as the proposed treatment plan, please see below.  Controlled Substance Pharmacotherapy Assessment REMS (Risk Evaluation and Mitigation Strategy)  Analgesic: Hydrocodone/acetaminophen 5/325 mg 1 tablet 3 times daily (fill date 08/07/2017) hydrocodone 15 mg/day Highest recorded MME/day: 15 mg/day MME/day: 15 mg/day Pill Count: None expected due to no prior prescriptions written by our practice. Hart Rochester, RN  09/04/2017  8:44 AM  Sign at close encounter Safety precautions to be maintained throughout the outpatient stay will include: orient to surroundings, keep bed in low position, maintain call bell within reach at all times, provide assistance with transfer out of bed and ambulation.    Pharmacokinetics: Liberation and absorption (onset of action): WNL Distribution (time to peak effect): WNL Metabolism and excretion (duration of action): WNL         Pharmacodynamics: Desired effects: Analgesia: Mr. Noyce reports >50% benefit. Functional ability: Patient reports that medication allows him to accomplish basic ADLs Clinically meaningful improvement in function (CMIF): Sustained CMIF goals met Perceived effectiveness: Described as relatively effective, allowing for increase in activities of daily living (ADL) Undesirable effects: Side-effects or Adverse reactions: None reported Monitoring: [place] PMP: Online review of the past 83-monthperiod previously conducted. Not applicable at this point since we have not taken over the patient's medication management yet. List of other Serum/Urine Drug Screening Test(s):  No results found for: AMPHSCRSER,  BARBSCRSER, BENZOSCRSER, COCAINSCRSER, COCAINSCRNUR, PCPSCRSER, THCSCRSER, THCU, CANNABQUANT, OPIATESCRSER, OFargo PNaples EWarsawList of all UDS test(s) done:  Lab Results  Component Value Date   SUMMARY FINAL 08/17/2017   Last UDS on record: Summary  Date Value Ref Range Status  08/17/2017 FINAL  Final    Comment:    ==================================================================== TOXASSURE COMP DRUG ANALYSIS,UR ==================================================================== Test                             Result       Flag       Units Drug Present and Declared for Prescription Verification   Hydrocodone                    68           EXPECTED   ng/mg creat   Hydromorphone                  23           EXPECTED   ng/mg creat   Norhydrocodone                 109          EXPECTED   ng/mg creat    Sources of hydrocodone include scheduled prescription    medications. Hydromorphone and norhydrocodone are expected    metabolites of hydrocodone. Hydromorphone is also  available as a    scheduled prescription medication.   Gabapentin                     PRESENT      EXPECTED   Acetaminophen                  PRESENT      EXPECTED   Ibuprofen                      PRESENT      EXPECTED Drug Absent but Declared for Prescription Verification   Tizanidine                     Not Detected UNEXPECTED    Tizanidine, as indicated in the declared medication list, is not    always detected even when used as directed. ==================================================================== Test                      Result    Flag   Units      Ref Range   Creatinine              266              mg/dL      >=20 ==================================================================== Declared Medications:  The flagging and interpretation on this report are based on the  following declared medications.  Unexpected results may arise from  inaccuracies in the declared medications.  **Note:  The testing scope of this panel includes these medications:  Gabapentin  Hydrocodone (Hydrocodone-Acetaminophen)  **Note: The testing scope of this panel does not include small to  moderate amounts of these reported medications:  Acetaminophen (Hydrocodone-Acetaminophen)  Ibuprofen  Tizanidine  **Note: The testing scope of this panel does not include following  reported medications:  Fexofenadine  Fluticasone ==================================================================== For clinical consultation, please call [phone]. ====================================================================    UDS interpretation: No unexpected findings.          Medication Assessment Form: Patient introduced to form today Treatment compliance: Treatment may start today if patient agrees with proposed plan. Evaluation of compliance is not applicable at this point Risk Assessment Profile: Aberrant behavior: See initial evaluations. None observed or detected today Comorbid factors increasing risk of overdose: See initial evaluation. No additional risks detected today Medical Psychology Evaluation: Please see scanned results in medical record. Opioid Risk Tool - 08/17/17 0809      Family History of Substance Abuse   Alcohol  Positive Male    Illegal Drugs  Negative    Rx Drugs  Negative      Personal History of Substance Abuse   Alcohol  Negative    Illegal Drugs  Negative    Rx Drugs  Negative      Age   Age between 40-45 years   Yes      History of Preadolescent Sexual Abuse   History of Preadolescent Sexual Abuse  Negative or Male      Psychological Disease   Psychological Disease  Positive anxiety   anxiety   Depression  Positive      Total Score   Opioid Risk Tool Scoring  5    Opioid Risk Interpretation  Moderate Risk      ORT Scoring interpretation table:  Score <3 = Low Risk for SUD  Score between 4-7 = Moderate Risk for SUD  Score >8 = High Risk for Opioid Abuse    Risk  Mitigation Strategies:  Patient opioid safety counseling: Completed today. Counseling provided to patient as per "Patient Counseling Document". Document signed by patient, attesting to counseling and understanding Patient-Prescriber Agreement (PPA): Obtained today.  Controlled substance notification to other providers: Written and sent today.  Pharmacologic Plan: Today we may be taking over the patient's pharmacological regimen. See below. No opioids prescribed today. The patient has indicated that he is interested in getting better so that he does not need to take any pain medication. He indicated wanting to get away from opioids.  Laboratory Chemistry  Inflammation Markers (CRP: Acute Phase) (ESR: Chronic Phase) Lab Results  Component Value Date   CRP 11 (H) 08/17/2017   ESRSEDRATE 29 (H) 08/17/2017                         Rheumatology Markers No results found for: RF, ANA, LABURIC, URICUR, LYMEIGGIGMAB, LYMEABIGMQN, HLAB27                      Renal Function Markers Lab Results  Component Value Date   BUN 9 08/17/2017   CREATININE 1.19 08/17/2017   BCR 8 (L) 08/17/2017   GFRAA 91 08/17/2017   GFRNONAA 79 08/17/2017                             Hepatic Function Markers Lab Results  Component Value Date   AST 14 08/17/2017   ALBUMIN 4.4 08/17/2017   ALKPHOS 102 08/17/2017                        Electrolytes Lab Results  Component Value Date   NA 141 08/17/2017   K 4.2 08/17/2017   CL 101 08/17/2017   CALCIUM 9.5 08/17/2017   MG 2.1 08/17/2017                        Neuropathy Markers Lab Results  Component Value Date   VITAMINB12 281 08/17/2017                        Bone Pathology Markers Lab Results  Component Value Date   25OHVITD1 12 (L) 08/17/2017   25OHVITD2 <1.0 08/17/2017   25OHVITD3 12 08/17/2017                         Coagulation Parameters Lab Results  Component Value Date   PLT 334 08/10/2017                         Cardiovascular Markers Lab Results  Component Value Date   HGB 15.5 08/10/2017   HCT 44.9 08/10/2017                         CA Markers No results found for: CEA, CA125, LABCA2                      Note: Lab results reviewed.  Recent Diagnostic Imaging Review  Cervical Imaging: Cervical DG Bending/F/E views:  Results for orders placed during the hospital encounter of 08/17/17  DG Cervical Spine With Flex & Extend   Narrative CLINICAL DATA:  37 year old male with neck pain for 4 months. No known injury.  EXAM: CERVICAL SPINE COMPLETE WITH FLEXION AND EXTENSION  VIEWS  COMPARISON:  None.  FINDINGS: Mild reversal of cervical lordosis on neutral imaging. Cervicothoracic junction alignment is within normal limits. Prevertebral soft tissue contour is normal.  Flexion and extension imaging provided. Relatively good range of motion demonstrated in both flexion and extension. No abnormal motion.  Bilateral posterior element alignment is within normal limits. Normal cervical AP alignment. Normal C1-C2 alignment and joint spaces. There is disc space loss at C5-C6, most more so posteriorly, associated with some endplate spurring. No other osseous degeneration identified.  Negative visible upper chest.  IMPRESSION: 1. Chronic C5-C6 disc and endplate degeneration. 2. Mild reversal of the normal cervical lordosis in neutral positioning, but normal flexion/extension. 3.  No acute osseous abnormality identified.   Electronically Signed   By: Genevie Ann M.D.   On: 08/17/2017 16:48    Thoracic Imaging: Thoracic DG w/swimmers view:  Results for orders placed during the hospital encounter of 05/02/17  DG Thoracic Spine W/Swimmers   Narrative EXAM: THORACIC SPINE - 3 VIEWS  COMPARISON:  None.  FINDINGS: There is no evidence of thoracic spine fracture. Alignment is normal. No other significant bone abnormalities are identified.  IMPRESSION: Negative.   Electronically  Signed   By: Kathreen Devoid   On: 05/02/2017 15:57    Complexity Note: Imaging results reviewed. Results shared with Mr. Virgil, using Layman's terms.                         Meds   Current Outpatient Medications:  .  fexofenadine (ALLEGRA ALLERGY) 180 MG tablet, Take 1 tablet (180 mg total) by mouth daily., Disp: 90 tablet, Rfl: 1 .  fluticasone (FLONASE) 50 MCG/ACT nasal spray, Place 2 sprays into both nostrils daily., Disp: 16 g, Rfl: 6 .  gabapentin (NEURONTIN) 100 MG capsule, Take 1 at bedtime for 1 week, then 1 BID for 1 week, then 1 TID for 1 week. (Patient taking differently: Take 100 mg by mouth 2 (two) times daily. Take 1 at bedtime for 1 week, then 1 BID for 1 week, then 1 TID for 1 week.), Disp: 90 capsule, Rfl: 3 .  HYDROcodone-acetaminophen (NORCO/VICODIN) 5-325 MG tablet, Take 0.5-1 tablets by mouth at bedtime., Disp: 30 tablet, Rfl: 0 .  ibuprofen (ADVIL,MOTRIN) 600 MG tablet, Take 1 tablet (600 mg total) by mouth every 6 (six) hours as needed., Disp: 30 tablet, Rfl: 0 .  tiZANidine (ZANAFLEX) 4 MG capsule, Take 1 capsule (4 mg total) by mouth 3 (three) times daily. (Patient taking differently: Take 4 mg by mouth at bedtime as needed. ), Disp: 90 capsule, Rfl: 1 .  Calcium Carbonate-Vit D-Min (GNP CALCIUM 1200) 1200-1000 MG-UNIT CHEW, Chew 1,200 mg by mouth daily with breakfast. Take in combination with vitamin D and magnesium., Disp: 30 tablet, Rfl: 5 .  Cholecalciferol (VITAMIN D3) 5000 units CAPS, Take 1 capsule (5,000 Units total) by mouth daily with breakfast. Take along with calcium and magnesium., Disp: 30 capsule, Rfl: 5 .  ergocalciferol (VITAMIN D2) 50000 units capsule, Take 1 capsule (50,000 Units total) by mouth 2 (two) times a week. X 6 weeks., Disp: 12 capsule, Rfl: 0 .  Magnesium 500 MG CAPS, Take 1 capsule (500 mg total) by mouth 2 (two) times daily at 8 am and 10 pm., Disp: 60 capsule, Rfl: 5  ROS  Constitutional: Denies any fever or chills Gastrointestinal: No  reported hemesis, hematochezia, vomiting, or acute GI distress Musculoskeletal: Denies any acute onset joint swelling, redness, loss of ROM,  or weakness Neurological: No reported episodes of acute onset apraxia, aphasia, dysarthria, agnosia, amnesia, paralysis, loss of coordination, or loss of consciousness  Allergies  Mr. Sigler has No Known Allergies.  PFSH  Drug: Mr. Sapp  reports that he does not use drugs. Alcohol:  reports that he drinks alcohol. Tobacco:  reports that he has quit smoking. His smoking use included cigarettes. He has quit using smokeless tobacco. His smokeless tobacco use included chew. Medical:  has a past medical history of Anxiety and Depression. Surgical: Mr. Mckone  has a past surgical history that includes Wisdom tooth extraction (37yo) and Wisdom tooth extraction (Bilateral, 2002). Family: family history includes Cancer in his maternal grandmother; Drug abuse in his father; Multiple sclerosis in his father.  Constitutional Exam  General appearance: Well nourished, well developed, and well hydrated. In no apparent acute distress Vitals:   09/04/17 0839  BP: (!) 152/95  Pulse: (!) 103  Resp: 18  Temp: 98.4 F (36.9 C)  TempSrc: Oral  SpO2: 98%  Weight: 212 lb (96.2 kg)  Height: '5\' 7"'$  (1.702 m)   BMI Assessment: Estimated body mass index is 33.2 kg/m as calculated from the following:   Height as of this encounter: '5\' 7"'$  (1.702 m).   Weight as of this encounter: 212 lb (96.2 kg).  BMI interpretation table: BMI level Category Range association with higher incidence of chronic pain  <18 kg/m2 Underweight   18.5-24.9 kg/m2 Ideal body weight   25-29.9 kg/m2 Overweight Increased incidence by 20%  30-34.9 kg/m2 Obese (Class I) Increased incidence by 68%  35-39.9 kg/m2 Severe obesity (Class II) Increased incidence by 136%  >40 kg/m2 Extreme obesity (Class III) Increased incidence by 254%   Patient's current BMI Ideal Body weight  Body mass index is 33.2  kg/m. Ideal body weight: 66.1 kg (145 lb 11.6 oz) Adjusted ideal body weight: 78.1 kg (172 lb 3.8 oz)   BMI Readings from Last 4 Encounters:  09/04/17 33.20 kg/m  08/25/17 33.27 kg/m  08/17/17 33.52 kg/m  08/10/17 33.52 kg/m   Wt Readings from Last 4 Encounters:  09/04/17 212 lb (96.2 kg)  08/25/17 212 lb 6.4 oz (96.3 kg)  08/17/17 214 lb (97.1 kg)  08/10/17 214 lb (97.1 kg)  Psych/Mental status: Alert, oriented x 3 (person, place, & time)       Eyes: PERLA Respiratory: No evidence of acute respiratory distress  Cervical Spine Area Exam  Skin & Axial Inspection: No masses, redness, edema, swelling, or associated skin lesions Alignment: Symmetrical Functional ROM: Grossly intact ROM      Stability: No instability detected Muscle Tone/Strength: Guarding observed Sensory (Neurological): Movement-associated discomfort Palpation: No palpable anomalies              Upper Extremity (UE) Exam    Side: Right upper extremity  Side: Left upper extremity  Skin & Extremity Inspection: Skin color, temperature, and hair growth are WNL. No peripheral edema or cyanosis. No masses, redness, swelling, asymmetry, or associated skin lesions. No contractures.  Skin & Extremity Inspection: Skin color, temperature, and hair growth are WNL. No peripheral edema or cyanosis. No masses, redness, swelling, asymmetry, or associated skin lesions. No contractures.  Functional ROM: Unrestricted ROM          Functional ROM: Unrestricted ROM          Muscle Tone/Strength: Functionally intact. No obvious neuro-muscular anomalies detected.  Muscle Tone/Strength: Functionally intact. No obvious neuro-muscular anomalies detected.  Sensory (Neurological): Unimpaired  Sensory (Neurological): Unimpaired          Palpation: No palpable anomalies              Palpation: No palpable anomalies              Provocative Test(s):  Phalen's test: deferred Tinel's test: deferred Apley's scratch test (touch opposite  shoulder):  Action 1 (Across chest): deferred Action 2 (Overhead): deferred Action 3 (LB reach): deferred   Provocative Test(s):  Phalen's test: deferred Tinel's test: deferred Apley's scratch test (touch opposite shoulder):  Action 1 (Across chest): deferred Action 2 (Overhead): deferred Action 3 (LB reach): deferred    Thoracic Spine Area Exam  Skin & Axial Inspection: No masses, redness, or swelling Alignment: Symmetrical Functional ROM: Unrestricted ROM Stability: No instability detected Muscle Tone/Strength: Functionally intact. No obvious neuro-muscular anomalies detected. Sensory (Neurological): Unimpaired Muscle strength & Tone: No palpable anomalies  Lumbar Spine Area Exam  Skin & Axial Inspection: No masses, redness, or swelling Alignment: Symmetrical Functional ROM: Unrestricted ROM       Stability: No instability detected Muscle Tone/Strength: Functionally intact. No obvious neuro-muscular anomalies detected. Sensory (Neurological): Unimpaired Palpation: No palpable anomalies       Provocative Tests: Hyperextension/rotation test: deferred today       Lumbar quadrant test (Kemp's test): deferred today       Lateral bending test: deferred today       Patrick's Maneuver: deferred today                   FABER test: deferred today                   S-I anterior distraction/compression test: deferred today         S-I lateral compression test: deferred today         S-I Thigh-thrust test: deferred today         S-I Gaenslen's test: deferred today         Gait & Posture Assessment  Ambulation: Unassisted Gait: Relatively normal for age and body habitus Posture: WNL   Lower Extremity Exam    Side: Right lower extremity  Side: Left lower extremity  Stability: No instability observed          Stability: No instability observed          Skin & Extremity Inspection: Skin color, temperature, and hair growth are WNL. No peripheral edema or cyanosis. No masses, redness,  swelling, asymmetry, or associated skin lesions. No contractures.  Skin & Extremity Inspection: Skin color, temperature, and hair growth are WNL. No peripheral edema or cyanosis. No masses, redness, swelling, asymmetry, or associated skin lesions. No contractures.  Functional ROM: Unrestricted ROM                  Functional ROM: Unrestricted ROM                  Muscle Tone/Strength: Functionally intact. No obvious neuro-muscular anomalies detected.  Muscle Tone/Strength: Functionally intact. No obvious neuro-muscular anomalies detected.  Sensory (Neurological): Unimpaired  Sensory (Neurological): Unimpaired  Palpation: No palpable anomalies  Palpation: No palpable anomalies   Assessment & Plan  Primary Diagnosis & Pertinent Problem List: The primary encounter diagnosis was Chronic pain syndrome. Diagnoses of Chronic upper back pain (Midline), Cervical radiculitis (Left), Rhomboid muscle pain, Chronic thoracic back pain (Midline), DDD (degenerative disc disease), cervical, Pharmacologic therapy, Disorder of skeletal system, Problems influencing health status, Opiate use, and Vitamin D deficiency  were also pertinent to this visit.  Visit Diagnosis: 1. Chronic pain syndrome   2. Chronic upper back pain (Midline)   3. Cervical radiculitis (Left)   4. Rhomboid muscle pain   5. Chronic thoracic back pain (Midline)   6. DDD (degenerative disc disease), cervical   7. Pharmacologic therapy   8. Disorder of skeletal system   9. Problems influencing health status   10. Opiate use   11. Vitamin D deficiency    Problems updated and reviewed during this visit: Problem  Ddd (Degenerative Disc Disease), Cervical  Rhomboid Muscle Pain  Cervical radiculitis (C5) (Left)  Chronic upper back pain (Midline)  Chronic thoracic back pain (Midline)  Vitamin D Deficiency  Opiate Use    Plan of Care  Pharmacotherapy (Medications Ordered): Meds ordered this encounter  Medications  . ergocalciferol  (VITAMIN D2) 50000 units capsule    Sig: Take 1 capsule (50,000 Units total) by mouth 2 (two) times a week. X 6 weeks.    Dispense:  12 capsule    Refill:  0    Do not add this medication to the electronic "Automatic Refill" notification system. Patient may have prescription filled one day early if pharmacy is closed on scheduled refill date.  . Cholecalciferol (VITAMIN D3) 5000 units CAPS    Sig: Take 1 capsule (5,000 Units total) by mouth daily with breakfast. Take along with calcium and magnesium.    Dispense:  30 capsule    Refill:  5    Do not place medication on "Automatic Refill".  May substitute with similar over-the-counter product.  . Magnesium 500 MG CAPS    Sig: Take 1 capsule (500 mg total) by mouth 2 (two) times daily at 8 am and 10 pm.    Dispense:  60 capsule    Refill:  5    Do not place medication on "Automatic Refill".  The patient may use similar over-the-counter product.  . Calcium Carbonate-Vit D-Min (GNP CALCIUM 1200) 1200-1000 MG-UNIT CHEW    Sig: Chew 1,200 mg by mouth daily with breakfast. Take in combination with vitamin D and magnesium.    Dispense:  30 tablet    Refill:  5    Do not place medication on "Automatic Refill".  May substitute with similar over-the-counter product.    Procedure Orders     Cervical Epidural Injection Lab Orders  No laboratory test(s) ordered today   Imaging Orders  No imaging studies ordered today   Referral Orders  No referral(s) requested today    Pharmacological management options:  Opioid Analgesics: We'll take over management today. See above orders. No opioids prescribed. Membrane stabilizer: We have discussed the possibility of optimizing this mode of therapy, if tolerated Muscle relaxant: We have discussed the possibility of a trial NSAID: We have discussed the possibility of a trial Other analgesic(s): To be determined at a later time   Interventional management options: Planned, scheduled, and/or pending:     Diagnostic Left Cervical ESI #1 under fluoroscopic guidance and IV sedation    Considering:   Diagnostic midline  Cervical ESI #1  Diagnostic trigger point injections  Diagnostic midline thoracic epidural steroid injection  Diagnostic midline thoracic facet nerve block    PRN Procedures:   None at this time   Provider-requested follow-up: Return for Procedure (no sedation): (L) CESI #1.  No future appointments.  Primary Care Physician: Valerie Roys, DO Location: [facility] Note by: Gaspar Cola, MD Date: 09/04/2017; Time: 11:09  AM

## 2017-09-04 ENCOUNTER — Other Ambulatory Visit: Payer: Self-pay

## 2017-09-04 ENCOUNTER — Ambulatory Visit: Payer: 59 | Attending: Pain Medicine | Admitting: Pain Medicine

## 2017-09-04 ENCOUNTER — Encounter: Payer: Self-pay | Admitting: Pain Medicine

## 2017-09-04 VITALS — BP 152/95 | HR 103 | Temp 98.4°F | Resp 18 | Ht 67.0 in | Wt 212.0 lb

## 2017-09-04 DIAGNOSIS — E559 Vitamin D deficiency, unspecified: Secondary | ICD-10-CM | POA: Diagnosis not present

## 2017-09-04 DIAGNOSIS — G894 Chronic pain syndrome: Secondary | ICD-10-CM | POA: Insufficient documentation

## 2017-09-04 DIAGNOSIS — Z789 Other specified health status: Secondary | ICD-10-CM

## 2017-09-04 DIAGNOSIS — Z79899 Other long term (current) drug therapy: Secondary | ICD-10-CM | POA: Diagnosis not present

## 2017-09-04 DIAGNOSIS — M899 Disorder of bone, unspecified: Secondary | ICD-10-CM

## 2017-09-04 DIAGNOSIS — M549 Dorsalgia, unspecified: Secondary | ICD-10-CM

## 2017-09-04 DIAGNOSIS — M7918 Myalgia, other site: Secondary | ICD-10-CM | POA: Insufficient documentation

## 2017-09-04 DIAGNOSIS — M791 Myalgia, unspecified site: Secondary | ICD-10-CM | POA: Diagnosis not present

## 2017-09-04 DIAGNOSIS — M546 Pain in thoracic spine: Secondary | ICD-10-CM

## 2017-09-04 DIAGNOSIS — Z79891 Long term (current) use of opiate analgesic: Secondary | ICD-10-CM | POA: Diagnosis not present

## 2017-09-04 DIAGNOSIS — M5412 Radiculopathy, cervical region: Secondary | ICD-10-CM | POA: Insufficient documentation

## 2017-09-04 DIAGNOSIS — F329 Major depressive disorder, single episode, unspecified: Secondary | ICD-10-CM | POA: Insufficient documentation

## 2017-09-04 DIAGNOSIS — G8929 Other chronic pain: Secondary | ICD-10-CM

## 2017-09-04 DIAGNOSIS — M5010 Cervical disc disorder with radiculopathy, unspecified cervical region: Secondary | ICD-10-CM | POA: Diagnosis not present

## 2017-09-04 DIAGNOSIS — Z87891 Personal history of nicotine dependence: Secondary | ICD-10-CM | POA: Insufficient documentation

## 2017-09-04 DIAGNOSIS — M5030 Other cervical disc degeneration, unspecified cervical region: Secondary | ICD-10-CM | POA: Insufficient documentation

## 2017-09-04 DIAGNOSIS — Z5181 Encounter for therapeutic drug level monitoring: Secondary | ICD-10-CM | POA: Diagnosis not present

## 2017-09-04 DIAGNOSIS — F1190 Opioid use, unspecified, uncomplicated: Secondary | ICD-10-CM

## 2017-09-04 DIAGNOSIS — F419 Anxiety disorder, unspecified: Secondary | ICD-10-CM | POA: Insufficient documentation

## 2017-09-04 MED ORDER — VITAMIN D3 125 MCG (5000 UT) PO CAPS: 1.0000 | ORAL_CAPSULE | Freq: Every day | ORAL | 5 refills | Status: AC

## 2017-09-04 MED ORDER — MAGNESIUM 500 MG PO CAPS: 500.0000 mg | ORAL_CAPSULE | Freq: Two times a day (BID) | ORAL | 5 refills | Status: AC

## 2017-09-04 MED ORDER — ERGOCALCIFEROL 1.25 MG (50000 UT) PO CAPS: 50000.0000 [IU] | ORAL_CAPSULE | ORAL | 0 refills | Status: DC

## 2017-09-04 MED ORDER — GNP CALCIUM 1200 1200-1000 MG-UNIT PO CHEW: 1200.0000 mg | CHEWABLE_TABLET | Freq: Every day | ORAL | 5 refills | Status: DC

## 2017-09-04 NOTE — Patient Instructions (Addendum)
____________________________________________________________________________________________  Preparing for your procedure (without sedation)  Instructions: . Oral Intake: Do not eat or drink anything for at least 3 hours prior to your procedure. . Transportation: Unless otherwise stated by your physician, you may drive yourself after the procedure. . Blood Pressure Medicine: Take your blood pressure medicine with a sip of water the morning of the procedure. . Blood thinners: Notify our staff if you are taking any blood thinners. Depending on which one you take, there will be specific instructions on how and when to stop it. . Diabetics on insulin: Notify the staff so that you can be scheduled 1st case in the morning. If your diabetes requires high dose insulin, take only  of your normal insulin dose the morning of the procedure and notify the staff that you have done so. . Preventing infections: Shower with an antibacterial soap the morning of your procedure.  . Build-up your immune system: Take 1000 mg of Vitamin C with every meal (3 times a day) the day prior to your procedure. Marland Kitchen Antibiotics: Inform the staff if you have a condition or reason that requires you to take antibiotics before dental procedures. . Pregnancy: If you are pregnant, call and cancel the procedure. . Sickness: If you have a cold, fever, or any active infections, call and cancel the procedure. . Arrival: You must be in the facility at least 30 minutes prior to your scheduled procedure. . Children: Do not bring any children with you. . Dress appropriately: Bring dark clothing that you would not mind if they get stained. . Valuables: Do not bring any jewelry or valuables.  Procedure appointments are reserved for interventional treatments only. Marland Kitchen No Prescription Refills. . No medication changes will be discussed during procedure appointments. . No disability issues will be discussed.  Reasons to call and reschedule or  cancel your procedure: (Following these recommendations will minimize the risk of a serious complication.) . Surgeries: Avoid having procedures within 2 weeks of any surgery. (Avoid for 2 weeks before or after any surgery). . Flu Shots: Avoid having procedures within 2 weeks of a flu shots or . (Avoid for 2 weeks before or after immunizations). . Barium: Avoid having a procedure within 7-10 days after having had a radiological study involving the use of radiological contrast. (Myelograms, Barium swallow or enema study). . Heart attacks: Avoid any elective procedures or surgeries for the initial 6 months after a "Myocardial Infarction" (Heart Attack). . Blood thinners: It is imperative that you stop these medications before procedures. Let us know if you if you take any blood thinner.  . Infection: Avoid procedures during or within two weeks of an infection (including chest colds or gastrointestinal problems). Symptoms associated with infections include: Localized redness, fever, chills, night sweats or profuse sweating, burning sensation when voiding, cough, congestion, stuffiness, runny nose, sore throat, diarrhea, nausea, vomiting, cold or Flu symptoms, recent or current infections. It is specially important if the infection is over the area that we intend to treat. Marland Kitchen Heart and lung problems: Symptoms that may suggest an active cardiopulmonary problem include: cough, chest pain, breathing difficulties or shortness of breath, dizziness, ankle swelling, uncontrolled high or unusually low blood pressure, and/or palpitations. If you are experiencing any of these symptoms, cancel your procedure and contact your primary care physician for an evaluation.  Remember:  Regular Business hours are:  Monday to Thursday 8:00 AM to 4:00 PM  Provider's Schedule: Milinda Pointer, MD:  Procedure days: Tuesday and Thursday 7:30 AM to  4:00 PM  Gillis Santa, MD:  Procedure days: Monday and Wednesday 7:30 AM to 4:00  PM ____________________________________________________________________________________________   ____________________________________________________________________________________________  Pain Scale  Introduction: The pain score used by this practice is the Verbal Numerical Rating Scale (VNRS-11). This is an 11-point scale. It is for adults and children 10 years or older. There are significant differences in how the pain score is reported, used, and applied. Forget everything you learned in the past and learn this scoring system.  General Information: The scale should reflect your current level of pain. Unless you are specifically asked for the level of your worst pain, or your average pain. If you are asked for one of these two, then it should be understood that it is over the past 24 hours.  Basic Activities of Daily Living (ADL): Personal hygiene, dressing, eating, transferring, and using restroom.  Instructions: Most patients tend to report their level of pain as a combination of two factors, their physical pain and their psychosocial pain. This last one is also known as "suffering" and it is reflection of how physical pain affects you socially and psychologically. From now on, report them separately. From this point on, when asked to report your pain level, report only your physical pain. Use the following table for reference.  Pain Clinic Pain Levels (0-5/10)  Pain Level Score  Description  No Pain 0   Mild pain 1 Nagging, annoying, but does not interfere with basic activities of daily living (ADL). Patients are able to eat, bathe, get dressed, toileting (being able to get on and off the toilet and perform personal hygiene functions), transfer (move in and out of bed or a chair without assistance), and maintain continence (able to control bladder and bowel functions). Blood pressure and heart rate are unaffected. A normal heart rate for a healthy adult ranges from 60 to 100 bpm (beats  per minute).   Mild to moderate pain 2 Noticeable and distracting. Impossible to hide from other people. More frequent flare-ups. Still possible to adapt and function close to normal. It can be very annoying and may have occasional stronger flare-ups. With discipline, patients may get used to it and adapt.   Moderate pain 3 Interferes significantly with activities of daily living (ADL). It becomes difficult to feed, bathe, get dressed, get on and off the toilet or to perform personal hygiene functions. Difficult to get in and out of bed or a chair without assistance. Very distracting. With effort, it can be ignored when deeply involved in activities.   Moderately severe pain 4 Impossible to ignore for more than a few minutes. With effort, patients may still be able to manage work or participate in some social activities. Very difficult to concentrate. Signs of autonomic nervous system discharge are evident: dilated pupils (mydriasis); mild sweating (diaphoresis); sleep interference. Heart rate becomes elevated (>115 bpm). Diastolic blood pressure (lower number) rises above 100 mmHg. Patients find relief in laying down and not moving.   Severe pain 5 Intense and extremely unpleasant. Associated with frowning face and frequent crying. Pain overwhelms the senses.  Ability to do any activity or maintain social relationships becomes significantly limited. Conversation becomes difficult. Pacing back and forth is common, as getting into a comfortable position is nearly impossible. Pain wakes you up from deep sleep. Physical signs will be obvious: pupillary dilation; increased sweating; goosebumps; brisk reflexes; cold, clammy hands and feet; nausea, vomiting or dry heaves; loss of appetite; significant sleep disturbance with inability to fall asleep or to  remain asleep. When persistent, significant weight loss is observed due to the complete loss of appetite and sleep deprivation.  Blood pressure and heart rate  becomes significantly elevated. Caution: If elevated blood pressure triggers a pounding headache associated with blurred vision, then the patient should immediately seek attention at an urgent or emergency care unit, as these may be signs of an impending stroke.    Emergency Department Pain Levels (6-10/10)  Emergency Room Pain 6 Severely limiting. Requires emergency care and should not be seen or managed at an outpatient pain management facility. Communication becomes difficult and requires great effort. Assistance to reach the emergency department may be required. Facial flushing and profuse sweating along with potentially dangerous increases in heart rate and blood pressure will be evident.   Distressing pain 7 Self-care is very difficult. Assistance is required to transport, or use restroom. Assistance to reach the emergency department will be required. Tasks requiring coordination, such as bathing and getting dressed become very difficult.   Disabling pain 8 Self-care is no longer possible. At this level, pain is disabling. The individual is unable to do even the most "basic" activities such as walking, eating, bathing, dressing, transferring to a bed, or toileting. Fine motor skills are lost. It is difficult to think clearly.   Incapacitating pain 9 Pain becomes incapacitating. Thought processing is no longer possible. Difficult to remember your own name. Control of movement and coordination are lost.   The worst pain imaginable 10 At this level, most patients pass out from pain. When this level is reached, collapse of the autonomic nervous system occurs, leading to a sudden drop in blood pressure and heart rate. This in turn results in a temporary and dramatic drop in blood flow to the brain, leading to a loss of consciousness. Fainting is one of the body's self defense mechanisms. Passing out puts the brain in a calmed state and causes it to shut down for a while, in order to begin the healing  process.    Summary: 1. Refer to this scale when providing [place] with your pain level. 2. Be accurate and careful when reporting your pain level. This will help with your care. 3. Over-reporting your pain level will lead to loss of credibility. 4. Even a level of 1/10 means that there is pain and will be treated at our facility. 5. High, inaccurate reporting will be documented as "Symptom Exaggeration", leading to loss of credibility and suspicions of possible secondary gains such as obtaining more narcotics, or wanting to appear disabled, for fraudulent reasons. 6. Only pain levels of 5 or below will be seen at our facility. 7. Pain levels of 6 and above will be sent to the Emergency Department and the appointment cancelled. ____________________________________________________________________________________________    Pain Management Discharge Instructions  General Discharge Instructions :  If you need to reach your doctor call: Monday-Friday 8:00 am - 4:00 pm at [phone] or toll free [phone].  After clinic hours [phone] to have operator reach doctor.  Bring all of your medication bottles to all your appointments in the pain clinic.  To cancel or reschedule your appointment with Pain Management please remember to call 24 hours in advance to avoid a fee.  Refer to the educational materials which you have been given on: General Risks, I had my Procedure. Discharge Instructions, Post Sedation.  Post Procedure Instructions:  The drugs you were given will stay in your system until tomorrow, so for the next 24 hours you should not drive, make  any legal decisions or drink any alcoholic beverages.  You may eat anything you prefer, but it is better to start with liquids then soups and crackers, and gradually work up to solid foods.  Please notify your doctor immediately if you have any unusual bleeding, trouble breathing or pain that is not related to your normal  pain.  Depending on the type of procedure that was done, some parts of your body may feel week and/or numb.  This usually clears up by tonight or the next day.  Walk with the use of an assistive device or accompanied by an adult for the 24 hours.  You may use ice on the affected area for the first 24 hours.  Put ice in a Ziploc bag and cover with a towel and place against area 15 minutes on 15 minutes off.  You may switch to heat after 24 hours.GENERAL RISKS AND COMPLICATIONS  What are the risk, side effects and possible complications? Generally speaking, most procedures are safe.  However, with any procedure there are risks, side effects, and the possibility of complications.  The risks and complications are dependent upon the sites that are lesioned, or the type of nerve block to be performed.  The closer the procedure is to the spine, the more serious the risks are.  Great care is taken when placing the radio frequency needles, block needles or lesioning probes, but sometimes complications can occur. 1. Infection: Any time there is an injection through the skin, there is a risk of infection.  This is why sterile conditions are used for these blocks.  There are four possible types of infection. 1. Localized skin infection. 2. Central Nervous System Infection-This can be in the form of Meningitis, which can be deadly. 3. Epidural Infections-This can be in the form of an epidural abscess, which can cause pressure inside of the spine, causing compression of the spinal cord with subsequent paralysis. This would require an emergency surgery to decompress, and there are no guarantees that the patient would recover from the paralysis. 4. Discitis-This is an infection of the intervertebral discs.  It occurs in about 1% of discography procedures.  It is difficult to treat and it may lead to surgery.        2. Pain: the needles have to go through skin and soft tissues, will cause soreness.       3. Damage  to internal structures:  The nerves to be lesioned may be near blood vessels or    other nerves which can be potentially damaged.       4. Bleeding: Bleeding is more common if the patient is taking blood thinners such as  aspirin, Coumadin, Ticiid, Plavix, etc., or if he/she have some genetic predisposition  such as hemophilia. Bleeding into the spinal canal can cause compression of the spinal  cord with subsequent paralysis.  This would require an emergency surgery to  decompress and there are no guarantees that the patient would recover from the  paralysis.       5. Pneumothorax:  Puncturing of a lung is a possibility, every time a needle is introduced in  the area of the chest or upper back.  Pneumothorax refers to free air around the  collapsed lung(s), inside of the thoracic cavity (chest cavity).  Another two possible  complications related to a similar event would include: Hemothorax and Chylothorax.   These are variations of the Pneumothorax, where instead of air around the collapsed  lung(s), you may  have blood or chyle, respectively.       6. Spinal headaches: They may occur with any procedures in the area of the spine.       7. Persistent CSF (Cerebro-Spinal Fluid) leakage: This is a rare problem, but may occur  with prolonged intrathecal or epidural catheters either due to the formation of a fistulous  track or a dural tear.       8. Nerve damage: By working so close to the spinal cord, there is always a possibility of  nerve damage, which could be as serious as a permanent spinal cord injury with  paralysis.       9. Death:  Although rare, severe deadly allergic reactions known as "Anaphylactic  reaction" can occur to any of the medications used.      10. Worsening of the symptoms:  We can always make thing worse.  What are the chances of something like this happening? Chances of any of this occuring are extremely low.  By statistics, you have more of a chance of getting killed in a motor  vehicle accident: while driving to the hospital than any of the above occurring .  Nevertheless, you should be aware that they are possibilities.  In general, it is similar to taking a shower.  Everybody knows that you can slip, hit your head and get killed.  Does that mean that you should not shower again?  Nevertheless always keep in mind that statistics do not mean anything if you happen to be on the wrong side of them.  Even if a procedure has a 1 (one) in a 1,000,000 (million) chance of going wrong, it you happen to be that one..Also, keep in mind that by statistics, you have more of a chance of having something go wrong when taking medications.  Who should not have this procedure? If you are on a blood thinning medication (e.g. Coumadin, Plavix, see list of "Blood Thinners"), or if you have an active infection going on, you should not have the procedure.  If you are taking any blood thinners, please inform your physician.  How should I prepare for this procedure?  Do not eat or drink anything at least six hours prior to the procedure.  Bring a driver with you .  It cannot be a taxi.  Come accompanied by an adult that can drive you back, and that is strong enough to help you if your legs get weak or numb from the local anesthetic.  Take all of your medicines the morning of the procedure with just enough water to swallow them.  If you have diabetes, make sure that you are scheduled to have your procedure done first thing in the morning, whenever possible.  If you have diabetes, take only half of your insulin dose and notify our nurse that you have done so as soon as you arrive at the clinic.  If you are diabetic, but only take blood sugar pills (oral hypoglycemic), then do not take them on the morning of your procedure.  You may take them after you have had the procedure.  Do not take aspirin or any aspirin-containing medications, at least eleven (11) days prior to the procedure.  They may  prolong bleeding.  Wear loose fitting clothing that may be easy to take off and that you would not mind if it got stained with Betadine or blood.  Do not wear any jewelry or perfume  Remove any nail coloring.  It will interfere with  some of our monitoring equipment.  NOTE: Remember that this is not meant to be interpreted as a complete list of all possible complications.  Unforeseen problems may occur.  BLOOD THINNERS The following drugs contain aspirin or other products, which can cause increased bleeding during surgery and should not be taken for 2 weeks prior to and 1 week after surgery.  If you should need take something for relief of minor pain, you may take acetaminophen which is found in Tylenol,m Datril, Anacin-3 and Panadol. It is not blood thinner. The products listed below are.  Do not take any of the products listed below in addition to any listed on your instruction sheet.  A.P.C or A.P.C with Codeine Codeine Phosphate Capsules #3 Ibuprofen Ridaura  ABC compound Congesprin Imuran rimadil  Advil Cope Indocin Robaxisal  Alka-Seltzer Effervescent Pain Reliever and Antacid Coricidin or Coricidin-D  Indomethacin Rufen  Alka-Seltzer plus Cold Medicine Cosprin Ketoprofen S-A-C Tablets  Anacin Analgesic Tablets or Capsules Coumadin Korlgesic Salflex  Anacin Extra Strength Analgesic tablets or capsules CP-2 Tablets Lanoril Salicylate  Anaprox Cuprimine Capsules Levenox Salocol  Anexsia-D Dalteparin Magan Salsalate  Anodynos Darvon compound Magnesium Salicylate Sine-off  Ansaid Dasin Capsules Magsal Sodium Salicylate  Anturane Depen Capsules Marnal Soma  APF Arthritis pain formula Dewitt's Pills Measurin Stanback  Argesic Dia-Gesic Meclofenamic Sulfinpyrazone  Arthritis Bayer Timed Release Aspirin Diclofenac Meclomen Sulindac  Arthritis pain formula Anacin Dicumarol Medipren Supac  Analgesic (Safety coated) Arthralgen Diffunasal Mefanamic Suprofen  Arthritis Strength Bufferin  Dihydrocodeine Mepro Compound Suprol  Arthropan liquid Dopirydamole Methcarbomol with Aspirin Synalgos  ASA tablets/Enseals Disalcid Micrainin Tagament  Ascriptin Doan's Midol Talwin  Ascriptin A/D Dolene Mobidin Tanderil  Ascriptin Extra Strength Dolobid Moblgesic Ticlid  Ascriptin with Codeine Doloprin or Doloprin with Codeine Momentum Tolectin  Asperbuf Duoprin Mono-gesic Trendar  Aspergum Duradyne Motrin or Motrin IB Triminicin  Aspirin plain, buffered or enteric coated Durasal Myochrisine Trigesic  Aspirin Suppositories Easprin Nalfon Trillsate  Aspirin with Codeine Ecotrin Regular or Extra Strength Naprosyn Uracel  Atromid-S Efficin Naproxen Ursinus  Auranofin Capsules Elmiron Neocylate Vanquish  Axotal Emagrin Norgesic Verin  Azathioprine Empirin or Empirin with Codeine Normiflo Vitamin E  Azolid Emprazil Nuprin Voltaren  Bayer Aspirin plain, buffered or children's or timed BC Tablets or powders Encaprin Orgaran Warfarin Sodium  Buff-a-Comp Enoxaparin Orudis Zorpin  Buff-a-Comp with Codeine Equegesic Os-Cal-Gesic   Buffaprin Excedrin plain, buffered or Extra Strength Oxalid   Bufferin Arthritis Strength Feldene Oxphenbutazone   Bufferin plain or Extra Strength Feldene Capsules Oxycodone with Aspirin   Bufferin with Codeine Fenoprofen Fenoprofen Pabalate or Pabalate-SF   Buffets II Flogesic Panagesic   Buffinol plain or Extra Strength Florinal or Florinal with Codeine Panwarfarin   Buf-Tabs Flurbiprofen Penicillamine   Butalbital Compound Four-way cold tablets Penicillin   Butazolidin Fragmin Pepto-Bismol   Carbenicillin Geminisyn Percodan   Carna Arthritis Reliever Geopen Persantine   Carprofen Gold's salt Persistin   Chloramphenicol Goody's Phenylbutazone   Chloromycetin Haltrain Piroxlcam   Clmetidine heparin Plaquenil   Cllnoril Hyco-pap Ponstel   Clofibrate Hydroxy chloroquine Propoxyphen         Before stopping any of these medications, be sure to consult the  physician who ordered them.  Some, such as Coumadin (Warfarin) are ordered to prevent or treat serious conditions such as "deep thrombosis", "pumonary embolisms", and other heart problems.  The amount of time that you may need off of the medication may also vary with the medication and the reason for which you were taking it.  If you are taking any of these medications, please make sure you notify your pain physician before you undergo any procedures.         Epidural Steroid Injection Patient Information  Description: The epidural space surrounds the nerves as they exit the spinal cord.  In some patients, the nerves can be compressed and inflamed by a bulging disc or a tight spinal canal (spinal stenosis).  By injecting steroids into the epidural space, we can bring irritated nerves into direct contact with a potentially helpful medication.  These steroids act directly on the irritated nerves and can reduce swelling and inflammation which often leads to decreased pain.  Epidural steroids may be injected anywhere along the spine and from the neck to the low back depending upon the location of your pain.   After numbing the skin with local anesthetic (like Novocaine), a small needle is passed into the epidural space slowly.  You may experience a sensation of pressure while this is being done.  The entire block usually last less than 10 minutes.  Conditions which may be treated by epidural steroids:   Low back and leg pain  Neck and arm pain  Spinal stenosis  Post-laminectomy syndrome  Herpes zoster (shingles) pain  Pain from compression fractures  Preparation for the injection:  1. Do not eat any solid food or dairy products within 8 hours of your appointment.  2. You may drink clear liquids up to 3 hours before appointment.  Clear liquids include water, black coffee, juice or soda.  No milk or cream please. 3. You may take your regular medication, including pain medications, with a  sip of water before your appointment  Diabetics should hold regular insulin (if taken separately) and take 1/2 normal NPH dos the morning of the procedure.  Carry some sugar containing items with you to your appointment. 4. A driver must accompany you and be prepared to drive you home after your procedure.  5. Bring all your current medications with your. 6. An IV may be inserted and sedation may be given at the discretion of the physician.   7. A blood pressure cuff, EKG and other monitors will often be applied during the procedure.  Some patients may need to have extra oxygen administered for a short period. 8. You will be asked to provide medical information, including your allergies, prior to the procedure.  We must know immediately if you are taking blood thinners (like Coumadin/Warfarin)  Or if you are allergic to IV iodine contrast (dye). We must know if you could possible be pregnant.  Possible side-effects:  Bleeding from needle site  Infection (rare, may require surgery)  Nerve injury (rare)  Numbness & tingling (temporary)  Difficulty urinating (rare, temporary)  Spinal headache ( a headache worse with upright posture)  Light -headedness (temporary)  Pain at injection site (several days)  Decreased blood pressure (temporary)  Weakness in arm/leg (temporary)  Pressure sensation in back/neck (temporary)  Call if you experience:  Fever/chills associated with headache or increased back/neck pain.  Headache worsened by an upright position.  New onset weakness or numbness of an extremity below the injection site  Hives or difficulty breathing (go to the emergency room)  Inflammation or drainage at the infection site  Severe back/neck pain  Any new symptoms which are concerning to you  Please note:  Although the local anesthetic injected can often make your back or neck feel good for several hours after the injection, the pain will likely  return.  It takes 3-7  days for steroids to work in the epidural space.  You may not notice any pain relief for at least that one week.  If effective, we will often do a series of three injections spaced 3-6 weeks apart to maximally decrease your pain.  After the initial series, we generally will wait several months before considering a repeat injection of the same type.  If you have any questions, please call [phone] [facility]

## 2017-09-04 NOTE — Progress Notes (Signed)
Safety precautions to be maintained throughout the outpatient stay will include: orient to surroundings, keep bed in low position, maintain call bell within reach at all times, provide assistance with transfer out of bed and ambulation.  

## 2017-09-19 ENCOUNTER — Encounter: Payer: Self-pay | Admitting: Pain Medicine

## 2017-09-19 ENCOUNTER — Other Ambulatory Visit: Payer: Self-pay

## 2017-09-19 ENCOUNTER — Ambulatory Visit
Admission: RE | Admit: 2017-09-19 | Discharge: 2017-09-19 | Disposition: A | Discharge status: Home / Self Care | Payer: 59 | Source: Ambulatory Visit | Attending: Pain Medicine | Admitting: Pain Medicine

## 2017-09-19 ENCOUNTER — Ambulatory Visit (HOSPITAL_BASED_OUTPATIENT_CLINIC_OR_DEPARTMENT_OTHER): Payer: 59 | Admitting: Pain Medicine

## 2017-09-19 VITALS — BP 173/112 | HR 79 | Temp 97.8°F | Resp 13 | Ht 67.0 in | Wt 209.0 lb

## 2017-09-19 DIAGNOSIS — M791 Myalgia, unspecified site: Secondary | ICD-10-CM | POA: Diagnosis not present

## 2017-09-19 DIAGNOSIS — M5412 Radiculopathy, cervical region: Secondary | ICD-10-CM

## 2017-09-19 DIAGNOSIS — G8929 Other chronic pain: Secondary | ICD-10-CM

## 2017-09-19 DIAGNOSIS — M5030 Other cervical disc degeneration, unspecified cervical region: Secondary | ICD-10-CM | POA: Diagnosis not present

## 2017-09-19 DIAGNOSIS — M549 Dorsalgia, unspecified: Secondary | ICD-10-CM

## 2017-09-19 DIAGNOSIS — M542 Cervicalgia: Secondary | ICD-10-CM | POA: Diagnosis present

## 2017-09-19 DIAGNOSIS — Z79899 Other long term (current) drug therapy: Secondary | ICD-10-CM | POA: Insufficient documentation

## 2017-09-19 DIAGNOSIS — M5010 Cervical disc disorder with radiculopathy, unspecified cervical region: Secondary | ICD-10-CM | POA: Insufficient documentation

## 2017-09-19 DIAGNOSIS — M7918 Myalgia, other site: Secondary | ICD-10-CM

## 2017-09-19 DIAGNOSIS — Z79891 Long term (current) use of opiate analgesic: Secondary | ICD-10-CM | POA: Insufficient documentation

## 2017-09-19 MED ORDER — LIDOCAINE HCL 2 % IJ SOLN
INTRAMUSCULAR | Status: AC
  Filled 2017-09-19: qty 20

## 2017-09-19 MED ORDER — DEXAMETHASONE SODIUM PHOSPHATE 10 MG/ML IJ SOLN
10.0000 mg | Freq: Once | INTRAMUSCULAR | Status: AC
  Administered 2017-09-19: 10 mg

## 2017-09-19 MED ORDER — ROPIVACAINE HCL 2 MG/ML IJ SOLN
INTRAMUSCULAR | Status: AC
  Filled 2017-09-19: qty 10

## 2017-09-19 MED ORDER — SODIUM CHLORIDE 0.9% FLUSH
1.0000 mL | Freq: Once | INTRAVENOUS | Status: AC
  Administered 2017-09-19: 10 mL

## 2017-09-19 MED ORDER — DEXAMETHASONE SODIUM PHOSPHATE 10 MG/ML IJ SOLN
INTRAMUSCULAR | Status: AC
  Filled 2017-09-19: qty 1

## 2017-09-19 MED ORDER — LIDOCAINE HCL 2 % IJ SOLN
20.0000 mL | Freq: Once | INTRAMUSCULAR | Status: AC
  Administered 2017-09-19: 400 mg

## 2017-09-19 MED ORDER — MIDAZOLAM HCL 5 MG/5ML IJ SOLN: 1.0000 mg | INTRAMUSCULAR | Status: DC | PRN

## 2017-09-19 MED ORDER — FENTANYL CITRATE (PF) 100 MCG/2ML IJ SOLN: 25.0000 ug | INTRAMUSCULAR | Status: DC | PRN

## 2017-09-19 MED ORDER — LACTATED RINGERS IV SOLN: 1000.0000 mL | Freq: Once | INTRAVENOUS | Status: DC

## 2017-09-19 MED ORDER — IOPAMIDOL (ISOVUE-M 200) INJECTION 41%
INTRAMUSCULAR | Status: AC
  Filled 2017-09-19: qty 10

## 2017-09-19 MED ORDER — ROPIVACAINE HCL 2 MG/ML IJ SOLN
1.0000 mL | Freq: Once | INTRAMUSCULAR | Status: AC
  Administered 2017-09-19: 10 mL via EPIDURAL

## 2017-09-19 MED ORDER — IOPAMIDOL (ISOVUE-M 200) INJECTION 41%
10.0000 mL | Freq: Once | INTRAMUSCULAR | Status: AC
  Administered 2017-09-19: 10 mL via EPIDURAL
  Filled 2017-09-19: qty 10

## 2017-09-19 MED ORDER — SODIUM CHLORIDE 0.9 % IJ SOLN
INTRAMUSCULAR | Status: AC
  Filled 2017-09-19: qty 10

## 2017-09-19 NOTE — Patient Instructions (Signed)

## 2017-09-19 NOTE — Progress Notes (Signed)
Patient's Name: Shawn Quinn  MRN: [id number]  Referring Provider: Delano Metz, MD  DOB: 1980/05/07  PCP: Dorcas Carrow, DO  DOS: 09/19/2017  Note by: Oswaldo Done, MD  Service setting: Ambulatory outpatient  Specialty: Interventional Pain Management  Patient type: Established  Location: [facility] (AMB) Pain Management Facility  Visit type: Interventional Procedure   Primary Reason for Visit: Interventional Pain Management Treatment. CC: Neck Pain  Procedure:          Anesthesia, Analgesia, Anxiolysis:  Type: Diagnostic, Inter-Laminar, Epidural Steroid Injection #1  Region: Posterior Cervico-thoracic Region Level: C7-T1 Laterality: Left-Sided Paramedial  Type: Local Anesthesia Indication(s): Analgesia         Route: Infiltration ([place]/IM) IV Access: Declined Sedation: Declined  Local Anesthetic: Lidocaine 1-2%   Indications: 1. DDD (degenerative disc disease), cervical   2. Cervicalgia   3. Cervical radiculitis (C5) (Left)   4. Chronic upper back pain (Midline)   5. Rhomboid muscle pain    Pain Score: Pre-procedure: 1 /10 Post-procedure: 0-No pain/10  Pre-op Assessment:  [name] is a 37 y.o. (year old), male patient, seen today for interventional treatment. He  has a past surgical history that includes Wisdom tooth extraction (37yo) and Wisdom tooth extraction (Bilateral, 2002). [name] has a current medication list which includes the following prescription(s): gnp calcium 1200, vitamin d3, ergocalciferol, fexofenadine, fluticasone, gabapentin, hydrocodone-acetaminophen, ibuprofen, magnesium, and tizanidine. His primarily concern today is the Neck Pain  Initial Vital Signs:  Pulse/HCG Rate: 86ECG Heart Rate: 73 Temp: 97.8 F (36.6 C) Resp: 18 BP: (!) 142/99 SpO2: 99 %  BMI: Estimated body mass index is 32.73 kg/m as calculated from the following:   Height as of this encounter: 5\' 7"  (1.702 m).   Weight as of this encounter: 209 lb (94.8 kg).  Risk  Assessment: Allergies: Reviewed. He has No Known Allergies.  Allergy Precautions: None required Coagulopathies: Reviewed. None identified.  Blood-thinner therapy: None at this time Active Infection(s): Reviewed. None identified. [name] is afebrile  Site Confirmation: [name] was asked to confirm the procedure and laterality before marking the site Procedure checklist: Completed Consent: Before the procedure and under the influence of no sedative(s), amnesic(s), or anxiolytics, the patient was informed of the treatment options, risks and possible complications. To fulfill our ethical and legal obligations, as recommended by the American Medical Association's Code of Ethics, I have informed the patient of my clinical impression; the nature and purpose of the treatment or procedure; the risks, benefits, and possible complications of the intervention; the alternatives, including doing nothing; the risk(s) and benefit(s) of the alternative treatment(s) or procedure(s); and the risk(s) and benefit(s) of doing nothing. The patient was provided information about the general risks and possible complications associated with the procedure. These may include, but are not limited to: failure to achieve desired goals, infection, bleeding, organ or nerve damage, allergic reactions, paralysis, and death. In addition, the patient was informed of those risks and complications associated to Spine-related procedures, such as failure to decrease pain; infection (i.e.: Meningitis, epidural or intraspinal abscess); bleeding (i.e.: epidural hematoma, subarachnoid hemorrhage, or any other type of intraspinal or peri-dural bleeding); organ or nerve damage (i.e.: Any type of peripheral nerve, nerve root, or spinal cord injury) with subsequent damage to sensory, motor, and/or autonomic systems, resulting in permanent pain, numbness, and/or weakness of one or several areas of the body; allergic reactions; (i.e.: anaphylactic  reaction); and/or death. Furthermore, the patient was informed of those risks and complications associated with the  medications. These include, but are not limited to: allergic reactions (i.e.: anaphylactic or anaphylactoid reaction(s)); adrenal axis suppression; blood sugar elevation that in diabetics may result in ketoacidosis or comma; water retention that in patients with history of congestive heart failure may result in shortness of breath, pulmonary edema, and decompensation with resultant heart failure; weight gain; swelling or edema; medication-induced neural toxicity; particulate matter embolism and blood vessel occlusion with resultant organ, and/or nervous system infarction; and/or aseptic necrosis of one or more joints. Finally, the patient was informed that Medicine is not an exact science; therefore, there is also the possibility of unforeseen or unpredictable risks and/or possible complications that may result in a catastrophic outcome. The patient indicated having understood very clearly. We have given the patient no guarantees and we have made no promises. Enough time was given to the patient to ask questions, all of which were answered to the patient's satisfaction. [name] has indicated that he wanted to continue with the procedure. Attestation: I, the ordering provider, attest that I have discussed with the patient the benefits, risks, side-effects, alternatives, likelihood of achieving goals, and potential problems during recovery for the procedure that I have provided informed consent. Date  Time: 09/19/2017 12:40 PM  Pre-Procedure Preparation:  Monitoring: As per clinic protocol. Respiration, ETCO2, SpO2, BP, heart rate and rhythm monitor placed and checked for adequate function Safety Precautions: Patient was assessed for positional comfort and pressure points before starting the procedure. Time-out: I initiated and conducted the "Time-out" before starting the procedure, as per  protocol. The patient was asked to participate by confirming the accuracy of the "Time Out" information. Verification of the correct person, site, and procedure were performed and confirmed by me, the nursing staff, and the patient. "Time-out" conducted as per Joint Commission's Universal Protocol (UP.01.01.01). Time: 1335  Description of Procedure:          Position: Prone with head of the table was raised to facilitate breathing. Target Area: For Epidural Steroid injections the target is the interlaminar space, initially targeting the lower border of the superior vertebral body lamina. Approach: Paramedial approach. Area Prepped: Entire PosteriorCervical Region Prepping solution: ChloraPrep (2% chlorhexidine gluconate and 70% isopropyl alcohol) Safety Precautions: Aspiration looking for blood return was conducted prior to all injections. At no point did we inject any substances, as a needle was being advanced. No attempts were made at seeking any paresthesias. Safe injection practices and needle disposal techniques used. Medications properly checked for expiration dates. SDV (single dose vial) medications used. Description of the Procedure: Protocol guidelines were followed. The procedure needle was introduced through the skin, ipsilateral to the reported pain, and advanced to the target area. Bone was contacted and the needle walked caudad, until the lamina was cleared. The epidural space was identified using "loss-of-resistance technique" with 2-3 ml of PF-NaCl (0.9% NSS), in a 5cc LOR glass syringe. Vitals:   09/19/17 1330 09/19/17 1336 09/19/17 1341 09/19/17 1345  BP: (!) 156/114 (!) 163/110 (!) 160/110 (!) 173/112  Pulse: 79     Resp: 12 17 14 13   Temp:      SpO2: 96% 94% 96% 95%  Weight:      Height:        Start Time: 1335 hrs. End Time: 1343 hrs. Materials:  Needle(s) Type: Epidural needle Gauge: 17G Length: 3.5-in Medication(s): Please see orders for medications and dosing  details.  Imaging Guidance (Spinal):          Type of Imaging Technique: Fluoroscopy Guidance (  Spinal) Indication(s): Assistance in needle guidance and placement for procedures requiring needle placement in or near specific anatomical locations not easily accessible without such assistance. Exposure Time: Please see nurses notes. Contrast: Before injecting any contrast, we confirmed that the patient did not have an allergy to iodine, shellfish, or radiological contrast. Once satisfactory needle placement was completed at the desired level, radiological contrast was injected. Contrast injected under live fluoroscopy. No contrast complications. See chart for type and volume of contrast used. Fluoroscopic Guidance: I was personally present during the use of fluoroscopy. "Tunnel Vision Technique" used to obtain the best possible view of the target area. Parallax error corrected before commencing the procedure. "Direction-depth-direction" technique used to introduce the needle under continuous pulsed fluoroscopy. Once target was reached, antero-posterior, oblique, and lateral fluoroscopic projection used confirm needle placement in all planes. Images permanently stored in EMR. Interpretation: I personally interpreted the imaging intraoperatively. Adequate needle placement confirmed in multiple planes. Appropriate spread of contrast into desired area was observed. No evidence of afferent or efferent intravascular uptake. No intrathecal or subarachnoid spread observed. Permanent images saved into the patient's record.  Antibiotic Prophylaxis:   Anti-infectives (From admission, onward)   None     Indication(s): None identified  Post-operative Assessment:  Post-procedure Vital Signs:  Pulse/HCG Rate: 7976 Temp: 97.8 F (36.6 C) Resp: 13 BP: (!) 173/112 SpO2: 95 %  EBL: None  Complications: No immediate post-treatment complications observed by team, or reported by patient.  Note: The patient  tolerated the entire procedure well. A repeat set of vitals were taken after the procedure and the patient was kept under observation following institutional policy, for this type of procedure. Post-procedural neurological assessment was performed, showing return to baseline, prior to discharge. The patient was provided with post-procedure discharge instructions, including a section on how to identify potential problems. Should any problems arise concerning this procedure, the patient was given instructions to immediately contact us, at any time, without hesitation. In any case, we plan to contact the patient by telephone for a follow-up status report regarding this interventional procedure.  Comments:  No additional relevant information.  Plan of Care    Imaging Orders     DG C-Arm 1-60 Min-No Report  Procedure Orders     Cervical Epidural Injection  Medications ordered for procedure: Meds ordered this encounter  Medications  . iopamidol (ISOVUE-M) 41 % intrathecal injection 10 mL    Must be Myelogram-compatible. If not available, you may substitute with a water-soluble, non-ionic, hypoallergenic, myelogram-compatible radiological contrast medium.  Marland Kitchen lidocaine (XYLOCAINE) 2 % (with pres) injection 400 mg  . DISCONTD: midazolam (VERSED) 5 MG/5ML injection 1-2 mg    Make sure Flumazenil is available in the pyxis when using this medication. If oversedation occurs, administer 0.2 mg IV over 15 sec. If after 45 sec no response, administer 0.2 mg again over 1 min; may repeat at 1 min intervals; not to exceed 4 doses (1 mg)  . DISCONTD: fentaNYL (SUBLIMAZE) injection 25-50 mcg    Make sure Narcan is available in the pyxis when using this medication. In the event of respiratory depression (RR< 8/min): Titrate NARCAN (naloxone) in increments of 0.1 to 0.2 mg IV at 2-3 minute intervals, until desired degree of reversal.  . DISCONTD: lactated ringers infusion 1,000 mL  . sodium chloride flush (NS) 0.9  % injection 1 mL  . ropivacaine (PF) 2 mg/mL (0.2%) (NAROPIN) injection 1 mL  . dexamethasone (DECADRON) injection 10 mg   Medications administered: We administered  iopamidol, lidocaine, sodium chloride flush, ropivacaine (PF) 2 mg/mL (0.2%), and dexamethasone.  See the medical record for exact dosing, route, and time of administration.  New Prescriptions   No medications on file   Disposition: Discharge home  Discharge Date & Time: 09/19/2017; 1350 hrs.   Physician-requested Follow-up: Return for post-procedure eval (2 wks), w/ Dr. Laban EmperorNaveira.  Future Appointments  Date Time Provider Department Center  10/11/2017  9:15 AM Delano MetzNaveira, Beverly Ferner, MD [facility] None   Primary Care Physician: Dorcas CarrowJohnson, Megan P, DO [facility] Note by: Oswaldo DoneFrancisco A Ollie Delano, MD Date: 09/19/2017; Time: 2:09 PM  Disclaimer:  Medicine is not an Visual merchandiserexact science. The only guarantee in medicine is that nothing is guaranteed. It is important to note that the decision to proceed with this intervention was based on the information collected from the patient. The Data and conclusions were drawn from the patient's questionnaire, the interview, and the physical examination. Because the information was provided in large part by the patient, it cannot be guaranteed that it has not been purposely or unconsciously manipulated. Every effort has been made to obtain as much relevant data as possible for this evaluation. It is important to note that the conclusions that lead to this procedure are derived in large part from the available data. Always take into account that the treatment will also be dependent on availability of resources and existing treatment guidelines, considered by other Pain Management Practitioners as being common knowledge and practice, at the time of the intervention. For Medico-Legal purposes, it is also important to point out that variation in procedural techniques and  pharmacological choices are the acceptable norm. The indications, contraindications, technique, and results of the above procedure should only be interpreted and judged by a Board-Certified Interventional Pain Specialist with extensive familiarity and expertise in the same exact procedure and technique.

## 2017-09-20 ENCOUNTER — Telehealth: Payer: Self-pay | Admitting: *Deleted

## 2017-09-20 NOTE — Telephone Encounter (Signed)
Spoke with patient re; procedure on yesterday, verbalizes no questions or concerns.  

## 2017-09-22 DIAGNOSIS — G4733 Obstructive sleep apnea (adult) (pediatric): Secondary | ICD-10-CM | POA: Diagnosis not present

## 2017-10-05 ENCOUNTER — Encounter: Payer: Self-pay | Admitting: Emergency Medicine

## 2017-10-05 ENCOUNTER — Other Ambulatory Visit: Payer: Self-pay

## 2017-10-05 ENCOUNTER — Emergency Department
Admission: EM | Admit: 2017-10-05 | Discharge: 2017-10-05 | Disposition: A | Discharge status: Home / Self Care | Payer: 59 | Attending: Student in an Organized Health Care Education/Training Program | Admitting: Student in an Organized Health Care Education/Training Program

## 2017-10-05 DIAGNOSIS — S61411A Laceration without foreign body of right hand, initial encounter: Secondary | ICD-10-CM | POA: Insufficient documentation

## 2017-10-05 DIAGNOSIS — Y939 Activity, unspecified: Secondary | ICD-10-CM | POA: Diagnosis not present

## 2017-10-05 DIAGNOSIS — Y929 Unspecified place or not applicable: Secondary | ICD-10-CM | POA: Insufficient documentation

## 2017-10-05 DIAGNOSIS — Z87891 Personal history of nicotine dependence: Secondary | ICD-10-CM | POA: Insufficient documentation

## 2017-10-05 DIAGNOSIS — Y999 Unspecified external cause status: Secondary | ICD-10-CM | POA: Diagnosis not present

## 2017-10-05 DIAGNOSIS — W260XXA Contact with knife, initial encounter: Secondary | ICD-10-CM | POA: Insufficient documentation

## 2017-10-05 DIAGNOSIS — Z79899 Other long term (current) drug therapy: Secondary | ICD-10-CM | POA: Diagnosis not present

## 2017-10-05 MED ORDER — LIDOCAINE HCL (PF) 1 % IJ SOLN
5.0000 mL | Freq: Once | INTRAMUSCULAR | Status: AC
  Administered 2017-10-05: 5 mL via INTRADERMAL
  Filled 2017-10-05: qty 5

## 2017-10-05 MED ORDER — BACITRACIN-NEOMYCIN-POLYMYXIN 400-5-5000 EX OINT
TOPICAL_OINTMENT | Freq: Once | CUTANEOUS | Status: AC
  Administered 2017-10-05: 19:00:00 via TOPICAL
  Filled 2017-10-05: qty 1

## 2017-10-05 MED ORDER — BACITRACIN-NEOMYCIN-POLYMYXIN 400-5-5000 EX OINT: 1.0000 "application " | TOPICAL_OINTMENT | Freq: Two times a day (BID) | CUTANEOUS | 0 refills | Status: DC

## 2017-10-05 NOTE — ED Triage Notes (Signed)
Pt in via POV; reports dropping a butcher knife onto left hand, approximately one inch laceration noted to base on palm, bleeding controlled at this time.

## 2017-10-05 NOTE — ED Notes (Signed)
See triage note  Presents with laceration to left wrist area  States he was cleaning up and he dropped the knife   Laceration noted to wrist

## 2017-10-05 NOTE — ED Provider Notes (Signed)
[facility] Emergency Department Provider Note  ____________________________________________  Time seen: Approximately 5:35 PM  I have reviewed the triage vital signs and the nursing notes.   HISTORY  Chief Complaint Laceration    HPI Shawn Quinn is a 37 y.o. male that presents to the emergency department for hand laceration. Patient cut himself with a knife. Last tetanus shot was last year.    Past Medical History:  Diagnosis Date  . Anxiety   . Depression     Patient Active Problem List   Diagnosis Date Noted  . Cervicalgia 09/19/2017  . DDD (degenerative disc disease), cervical 09/04/2017  . Rhomboid muscle pain 09/04/2017  . Vitamin D deficiency 09/04/2017  . Cervical radiculitis (C5) (Left) 09/04/2017  . Controlled substance agreement signed 08/25/2017  . Chronic pain syndrome 08/17/2017  . Chronic upper back pain (Midline) 08/17/2017  . Pharmacologic therapy 08/17/2017  . Disorder of skeletal system 08/17/2017  . Problems influencing health status 08/17/2017  . Opiate use 08/17/2017  . Chronic thoracic back pain (Midline) 06/07/2017  . Environmental allergies 05/01/2017    Past Surgical History:  Procedure Laterality Date  . WISDOM TOOTH EXTRACTION  37yo   37yo  . WISDOM TOOTH EXTRACTION Bilateral 2002    Prior to Admission medications   Medication Sig Start Date End Date Taking? Authorizing Provider  Calcium Carbonate-Vit D-Min (GNP CALCIUM 1200) 1200-1000 MG-UNIT CHEW Chew 1,200 mg by mouth daily with breakfast. Take in combination with vitamin D and magnesium. 09/04/17 03/03/18  Delano Metz, MD  Cholecalciferol (VITAMIN D3) 5000 units CAPS Take 1 capsule (5,000 Units total) by mouth daily with breakfast. Take along with calcium and magnesium. 09/04/17 03/03/18  Delano Metz, MD  ergocalciferol (VITAMIN D2) 50000 units capsule Take 1 capsule (50,000 Units total) by mouth 2 (two) times a week. X 6 weeks. 09/04/17 10/16/17   Delano Metz, MD  fexofenadine [facility] ALLERGY) 180 MG tablet Take 1 tablet (180 mg total) by mouth daily. 05/01/17   Johnson, Megan P, DO  fluticasone (FLONASE) 50 MCG/ACT nasal spray Place 2 sprays into both nostrils daily. 05/01/17   Johnson, Megan P, DO  gabapentin (NEURONTIN) 100 MG capsule Take 1 at bedtime for 1 week, then 1 BID for 1 week, then 1 TID for 1 week. Patient taking differently: Take 100 mg by mouth 2 (two) times daily. Take 1 at bedtime for 1 week, then 1 BID for 1 week, then 1 TID for 1 week. 08/07/17   Johnson, Megan P, DO  HYDROcodone-acetaminophen (NORCO/VICODIN) 5-325 MG tablet Take 0.5-1 tablets by mouth at bedtime. 08/25/17   Johnson, Megan P, DO  ibuprofen (ADVIL,MOTRIN) 600 MG tablet Take 1 tablet (600 mg total) by mouth every 6 (six) hours as needed. 08/10/17   Dionne Bucy, MD  Magnesium 500 MG CAPS Take 1 capsule (500 mg total) by mouth 2 (two) times daily at 8 am and 10 pm. 09/04/17 03/03/18  Delano Metz, MD  neomycin-bacitracin-polymyxin (NEOSPORIN) ointment Apply 1 application topically every 12 (twelve) hours. 10/05/17   Enid Derry, PA-C  tiZANidine (ZANAFLEX) 4 MG capsule Take 1 capsule (4 mg total) by mouth 3 (three) times daily. Patient taking differently: Take 4 mg by mouth at bedtime as needed.  08/07/17   Dorcas Carrow, DO    Allergies Patient has no known allergies.  Family History  Problem Relation Age of Onset  . Drug abuse Father   . Multiple sclerosis Father   . Cancer Maternal Grandmother  Breast and lung    Social History Social History   Tobacco Use  . Smoking status: Former Smoker    Types: Cigarettes  . Smokeless tobacco: Former Neurosurgeon    Types: Chew  Substance Use Topics  . Alcohol use: Yes    Comment: occassional   . Drug use: Never     Review of Systems  Gastrointestinal: No nausea, no vomiting.  Musculoskeletal: Positive for hand pain.  Skin: Negative for rash, ecchymosis. Positive for laceration.     ____________________________________________   PHYSICAL EXAM:  VITAL SIGNS: ED Triage Vitals  Enc Vitals Group     BP 10/05/17 1705 (!) 149/100     Pulse Rate 10/05/17 1705 90     Resp 10/05/17 1705 16     Temp 10/05/17 1705 98.4 F (36.9 C)     Temp Source 10/05/17 1705 Oral     SpO2 10/05/17 1705 96 %     Weight 10/05/17 1706 209 lb (94.8 kg)     Height 10/05/17 1706 5\' 7"  (1.702 m)     Head Circumference --      Peak Flow --      Pain Score 10/05/17 1705 2     Pain Loc --      Pain Edu? --      Excl. in GC? --      Constitutional: Alert and oriented. Well appearing and in no acute distress. Eyes: Conjunctivae are normal. PERRL. EOMI. Head: Atraumatic. ENT:      Ears:      Nose: No congestion/rhinnorhea.      Mouth/Throat: Mucous membranes are moist.  Neck: No stridor.  Cardiovascular: Normal rate, regular rhythm.  Good peripheral circulation. Respiratory: Normal respiratory effort without tachypnea or retractions. Lungs CTAB. Good air entry to the bases with no decreased or absent breath sounds. Musculoskeletal: Full range of motion to all extremities. No gross deformities appreciated. Neurologic:  Normal speech and language. No gross focal neurologic deficits are appreciated.  Skin:  Skin is warm, dry. 1cm laceration to left palm.  Psychiatric: Mood and affect are normal. Speech and behavior are normal. Patient exhibits appropriate insight and judgement.   ____________________________________________   LABS (all labs ordered are listed, but only abnormal results are displayed)  Labs Reviewed - No data to display ____________________________________________  EKG   ____________________________________________  RADIOLOGY   No results found.  ____________________________________________    PROCEDURES  Procedure(s) performed:    Procedures  LACERATION REPAIR Performed by: Max PA-C  Consent: Verbal consent obtained.  Consent given by:  patient  Prepped and Draped in normal sterile fashion  Wound explored: No foreign bodies   Laceration Location: left palm  Laceration Length: 1 cm  Anesthesia: None  Local anesthetic: lidocaine 1% without epinephrine  Anesthetic total: 3 ml  Irrigation method: syringe  Amount of cleaning: normal saline  Skin closure: 4-0 nylon  Number of sutures: 3  Technique: Simple interrupted  Patient tolerance: Patient tolerated the procedure well with no immediate complications.  Medications  neomycin-bacitracin-polymyxin (NEOSPORIN) ointment (has no administration in time range)  lidocaine (PF) (XYLOCAINE) 1 % injection 5 mL (5 mLs Intradermal Given by Other 10/05/17 1841)     ____________________________________________   INITIAL IMPRESSION / ASSESSMENT AND PLAN / ED COURSE  Pertinent labs & imaging results that were available during my care of the patient were reviewed by me and considered in my medical decision making (see chart for details).  Review of the [place] CSRS was performed in  accordance of the [facility] prior to dispensing any controlled drugs.   Patient's diagnosis is consistent with hand laceration.  Vital signs and exam are reassuring.  Laceration was repaired with stitches. Patient will be discharged home with prescriptions for neosporin. Patient is to follow up with PCP as directed. Patient is given ED precautions to return to the ED for any worsening or new symptoms.     ____________________________________________  FINAL CLINICAL IMPRESSION(S) / ED DIAGNOSES  Final diagnoses:  Laceration of right hand without foreign body, initial encounter      NEW MEDICATIONS STARTED DURING THIS VISIT:  ED Discharge Orders         Ordered    neomycin-bacitracin-polymyxin (NEOSPORIN) ointment  Every 12 hours     10/05/17 1845              This chart was dictated using voice recognition software/Dragon. Despite best efforts to proofread, errors can occur  which can change the meaning. Any change was purely unintentional.    Enid Derry, PA-C 10/05/17 2023    Willy Eddy, MD 10/05/17 2253

## 2017-10-08 NOTE — Progress Notes (Signed)
Patient's Name: Shawn Quinn  MRN: [id number]  Referring Provider: Valerie Roys, DO  DOB: 01-28-1981  PCP: Valerie Roys, DO  DOS: 10/11/2017  Note by: Gaspar Cola, MD  Service setting: Ambulatory outpatient  Specialty: Interventional Pain Management  Location: [facility] (AMB) Pain Management Facility    Patient type: Established   Primary Reason(s) for Visit: Encounter for post-procedure evaluation of chronic illness with mild to moderate exacerbation CC: Back Pain  HPI  Shawn Quinn is a 37 y.o. year old, male patient, who comes today for a post-procedure evaluation. He has Environmental allergies; Chronic thoracic back pain (Midline); Chronic pain syndrome; Chronic upper back pain (Midline); Pharmacologic therapy; Disorder of skeletal system; Problems influencing health status; Opiate use; Controlled substance agreement signed; DDD (degenerative disc disease), cervical; Rhomboid muscle pain; Vitamin D deficiency; Cervical radiculitis (C5) (Left); Cervicalgia; Cervical spondylosis without myelopathy (C5-6); Elevated sed rate; and Elevated C-reactive protein (CRP) on their problem list. His primarily concern today is the Back Pain  Pain Assessment: Location: Lower Back Radiating: Denies Onset: More than a month ago Duration: Chronic pain Quality: (no pain today) Severity: 0-No pain/10 (subjective, self-reported pain score)  Note: Reported level is compatible with observation.                         When using our objective Pain Scale, levels between 6 and 10/10 are said to belong in an emergency room, as it progressively worsens from a 6/10, described as severely limiting, requiring emergency care not usually available at an outpatient pain management facility. At a 6/10 level, communication becomes difficult and requires great effort. Assistance to reach the emergency department may be required. Facial flushing and profuse sweating along with potentially dangerous increases in heart rate  and blood pressure will be evident. Effect on ADL: no pain Timing: Intermittent Modifying factors: medications BP: 134/79  HR: 75  Shawn Quinn comes in today for post-procedure evaluation after the treatment done on 09/20/2017.  Further details on both, my assessment(s), as well as the proposed treatment plan, please see below.  Post-Procedure Assessment  09/19/2017 Procedure: Diagnostic left cervical epidural steroid injection #1 under fluoroscopic guidance, no sedation Pre-procedure pain score:  1/10 Post-procedure pain score: 0/10 (100% relief) Influential Factors: BMI: 32.89 kg/m Intra-procedural challenges: None observed.         Assessment challenges: None detected.              Reported side-effects: None.        Post-procedural adverse reactions or complications: None reported         Sedation: No sedation used. When no sedatives are used, the analgesic levels obtained are directly associated to the effectiveness of the local anesthetics. However, when sedation is provided, the level of analgesia obtained during the initial 1 hour following the intervention, is believed to be the result of a combination of factors. These factors may include, but are not limited to: 1. The effectiveness of the local anesthetics used. 2. The effects of the analgesic(s) and/or anxiolytic(s) used. 3. The degree of discomfort experienced by the patient at the time of the procedure. 4. The patients ability and reliability in recalling and recording the events. 5. The presence and influence of possible secondary gains and/or psychosocial factors. Reported result: Relief experienced during the 1st hour after the procedure: 100 % (Ultra-Short Term Relief)            Interpretative annotation: Clinically appropriate result. No IV Analgesic  or Anxiolytic given, therefore benefits are completely due to Local Anesthetic effects.          Effects of local anesthetic: The analgesic effects attained during this  period are directly associated to the localized infiltration of local anesthetics and therefore cary significant diagnostic value as to the etiological location, or anatomical origin, of the pain. Expected duration of relief is directly dependent on the pharmacodynamics of the local anesthetic used. Long-acting (4-6 hours) anesthetics used.  Reported result: Relief during the next 4 to 6 hour after the procedure: 50 %(for 24 hours, muscle spasms) (Short-Term Relief)            Interpretative annotation: Clinically appropriate result. Analgesia during this period is likely to be Local Anesthetic-related.          Long-term benefit: Defined as the period of time past the expected duration of local anesthetics (1 hour for short-acting and 4-6 hours for long-acting). With the possible exception of prolonged sympathetic blockade from the local anesthetics, benefits during this period are typically attributed to, or associated with, other factors such as analgesic sensory neuropraxia, antiinflammatory effects, or beneficial biochemical changes provided by agents other than the local anesthetics.  Reported result: Extended relief following procedure: 100 % (Long-Term Relief)            Interpretative annotation: Clinically possible results. Good relief. No permanent benefit expected. Inflammation plays a part in the etiology to the pain.          Current benefits: Defined as reported results that persistent at this point in time.   Analgesia: 75-100 % Shawn Quinn reports improvement of axial and dermatomal symptoms. Function: Shawn Quinn reports improvement in function ROM: Shawn Quinn reports improvement in ROM Interpretative annotation: Ongoing benefit. Therapeutic success. Effective therapeutic approach.          Interpretation: Results would suggest a successful diagnostic intervention.                  Plan:  Set up procedure as a PRN palliative treatment option for this patient.                 Laboratory Chemistry  Inflammation Markers (CRP: Acute Phase) (ESR: Chronic Phase) Lab Results  Component Value Date   CRP 11 (H) 08/17/2017   ESRSEDRATE 29 (H) 08/17/2017                         Renal Markers Lab Results  Component Value Date   BUN 9 08/17/2017   CREATININE 1.19 08/17/2017   BCR 8 (L) 08/17/2017   GFRAA 91 08/17/2017   GFRNONAA 79 08/17/2017                             Hepatic Markers Lab Results  Component Value Date   AST 14 08/17/2017   ALBUMIN 4.4 08/17/2017                        Neuropathy Markers Lab Results  Component Value Date   VITAMINB12 281 08/17/2017                        Hematology Parameters Lab Results  Component Value Date   PLT 334 08/10/2017   HGB 15.5 08/10/2017   HCT 44.9 08/10/2017  Note: Lab results reviewed.  Recent Imaging Results   Results for orders placed in visit on 09/19/17  DG C-Arm 1-60 Min-No Report   Narrative Fluoroscopy was utilized by the requesting physician.  No radiographic  interpretation.    Interpretation Report: Fluoroscopy was used during the procedure to assist with needle guidance. The images were interpreted intraoperatively by the requesting physician.  Meds   Current Outpatient Medications:  .  Cholecalciferol (VITAMIN D3) 5000 units CAPS, Take 1 capsule (5,000 Units total) by mouth daily with breakfast. Take along with calcium and magnesium., Disp: 30 capsule, Rfl: 5 .  fexofenadine (ALLEGRA ALLERGY) 180 MG tablet, Take 1 tablet (180 mg total) by mouth daily., Disp: 90 tablet, Rfl: 1 .  gabapentin (NEURONTIN) 100 MG capsule, Take 1 at bedtime for 1 week, then 1 BID for 1 week, then 1 TID for 1 week. (Patient taking differently: Take 100 mg by mouth 2 (two) times daily. Take 1 at bedtime for 1 week, then 1 BID for 1 week, then 1 TID for 1 week.), Disp: 90 capsule, Rfl: 3 .  ibuprofen (ADVIL,MOTRIN) 600 MG tablet, Take 1 tablet (600 mg total) by mouth every 6 (six)  hours as needed., Disp: 30 tablet, Rfl: 0 .  Magnesium 500 MG CAPS, Take 1 capsule (500 mg total) by mouth 2 (two) times daily at 8 am and 10 pm., Disp: 60 capsule, Rfl: 5 .  neomycin-bacitracin-polymyxin (NEOSPORIN) ointment, Apply 1 application topically every 12 (twelve) hours., Disp: 15 g, Rfl: 0 .  meloxicam (MOBIC) 15 MG tablet, Take 1 tablet (15 mg total) by mouth daily., Disp: 30 tablet, Rfl: 5  ROS  Constitutional: Denies any fever or chills Gastrointestinal: No reported hemesis, hematochezia, vomiting, or acute GI distress Musculoskeletal: Denies any acute onset joint swelling, redness, loss of ROM, or weakness Neurological: No reported episodes of acute onset apraxia, aphasia, dysarthria, agnosia, amnesia, paralysis, loss of coordination, or loss of consciousness  Allergies  Shawn Quinn has No Known Allergies.  PFSH  Drug: Shawn Quinn  reports that he does not use drugs. Alcohol:  reports that he drinks alcohol. Tobacco:  reports that he has quit smoking. His smoking use included cigarettes. He has quit using smokeless tobacco.  His smokeless tobacco use included chew. Medical:  has a past medical history of Anxiety and Depression. Surgical: Shawn Quinn  has a past surgical history that includes Wisdom tooth extraction (37yo) and Wisdom tooth extraction (Bilateral, 2002). Family: family history includes Cancer in his maternal grandmother; Drug abuse in his father; Multiple sclerosis in his father.  Constitutional Exam  General appearance: Well nourished, well developed, and well hydrated. In no apparent acute distress Vitals:   10/11/17 0905  BP: 134/79  Pulse: 75  Temp: 98.1 F (36.7 C)  SpO2: 96%  Weight: 210 lb (95.3 kg)  Height: 5' 7"  (1.702 m)   BMI Assessment: Estimated body mass index is 32.89 kg/m as calculated from the following:   Height as of this encounter: 5' 7"  (1.702 m).   Weight as of this encounter: 210 lb (95.3 kg).  BMI interpretation table: BMI  level Category Range association with higher incidence of chronic pain  <18 kg/m2 Underweight   18.5-24.9 kg/m2 Ideal body weight   25-29.9 kg/m2 Overweight Increased incidence by 20%  30-34.9 kg/m2 Obese (Class I) Increased incidence by 68%  35-39.9 kg/m2 Severe obesity (Class II) Increased incidence by 136%  >40 kg/m2 Extreme obesity (Class III) Increased incidence by 254%   Patient's current  BMI Ideal Body weight  Body mass index is 32.89 kg/m. Ideal body weight: 66.1 kg (145 lb 11.6 oz) Adjusted ideal body weight: 77.8 kg (171 lb 6.9 oz)   BMI Readings from Last 4 Encounters:  10/13/17 33.83 kg/m  10/11/17 32.89 kg/m  10/05/17 32.73 kg/m  09/19/17 32.73 kg/m   Wt Readings from Last 4 Encounters:  10/13/17 216 lb (98 kg)  10/11/17 210 lb (95.3 kg)  10/05/17 209 lb (94.8 kg)  09/19/17 209 lb (94.8 kg)  Psych/Mental status: Alert, oriented x 3 (person, place, & time)       Eyes: PERLA Respiratory: No evidence of acute respiratory distress  Cervical Spine Area Exam  Skin & Axial Inspection: No masses, redness, edema, swelling, or associated skin lesions Alignment: Symmetrical Functional ROM: Unrestricted ROM      Stability: No instability detected Muscle Tone/Strength: Functionally intact. No obvious neuro-muscular anomalies detected. Sensory (Neurological): Unimpaired Palpation: No palpable anomalies              Upper Extremity (UE) Exam    Side: Right upper extremity  Side: Left upper extremity  Skin & Extremity Inspection: Skin color, temperature, and hair growth are WNL. No peripheral edema or cyanosis. No masses, redness, swelling, asymmetry, or associated skin lesions. No contractures.  Skin & Extremity Inspection: Skin color, temperature, and hair growth are WNL. No peripheral edema or cyanosis. No masses, redness, swelling, asymmetry, or associated skin lesions. No contractures.  Functional ROM: Unrestricted ROM          Functional ROM: Unrestricted ROM           Muscle Tone/Strength: Functionally intact. No obvious neuro-muscular anomalies detected.  Muscle Tone/Strength: Functionally intact. No obvious neuro-muscular anomalies detected.  Sensory (Neurological): Unimpaired          Sensory (Neurological): Unimpaired          Palpation: No palpable anomalies              Palpation: No palpable anomalies              Provocative Test(s):  Phalen's test: deferred Tinel's test: deferred Apley's scratch test (touch opposite shoulder):  Action 1 (Across chest): deferred Action 2 (Overhead): deferred Action 3 (LB reach): deferred   Provocative Test(s):  Phalen's test: deferred Tinel's test: deferred Apley's scratch test (touch opposite shoulder):  Action 1 (Across chest): deferred Action 2 (Overhead): deferred Action 3 (LB reach): deferred    Thoracic Spine Area Exam  Skin & Axial Inspection: No masses, redness, or swelling Alignment: Symmetrical Functional ROM: Unrestricted ROM Stability: No instability detected Muscle Tone/Strength: Functionally intact. No obvious neuro-muscular anomalies detected. Sensory (Neurological): Unimpaired Muscle strength & Tone: No palpable anomalies  Lumbar Spine Area Exam  Skin & Axial Inspection: No masses, redness, or swelling Alignment: Symmetrical Functional ROM: Unrestricted ROM       Stability: No instability detected Muscle Tone/Strength: Functionally intact. No obvious neuro-muscular anomalies detected. Sensory (Neurological): Unimpaired Palpation: No palpable anomalies       Provocative Tests: Hyperextension/rotation test: deferred today       Lumbar quadrant test (Kemp's test): deferred today       Lateral bending test: deferred today       Patrick's Maneuver: deferred today                   FABER test: deferred today                   S-I anterior distraction/compression test:  deferred today         S-I lateral compression test: deferred today         S-I Thigh-thrust test: deferred today          S-I Gaenslen's test: deferred today          Gait & Posture Assessment  Ambulation: Unassisted Gait: Relatively normal for age and body habitus Posture: WNL   Lower Extremity Exam    Side: Right lower extremity  Side: Left lower extremity  Stability: No instability observed          Stability: No instability observed          Skin & Extremity Inspection: Skin color, temperature, and hair growth are WNL. No peripheral edema or cyanosis. No masses, redness, swelling, asymmetry, or associated skin lesions. No contractures.  Skin & Extremity Inspection: Skin color, temperature, and hair growth are WNL. No peripheral edema or cyanosis. No masses, redness, swelling, asymmetry, or associated skin lesions. No contractures.  Functional ROM: Unrestricted ROM                  Functional ROM: Unrestricted ROM                  Muscle Tone/Strength: Functionally intact. No obvious neuro-muscular anomalies detected.  Muscle Tone/Strength: Functionally intact. No obvious neuro-muscular anomalies detected.  Sensory (Neurological): Unimpaired  Sensory (Neurological): Unimpaired  Palpation: No palpable anomalies  Palpation: No palpable anomalies   Assessment  Primary Diagnosis & Pertinent Problem List: The primary encounter diagnosis was Cervicalgia. Diagnoses of Chronic upper back pain (Midline), Chronic thoracic back pain (Midline), Cervical radiculitis (C5) (Left), Cervical spondylosis without myelopathy (C5-6), DDD (degenerative disc disease), cervical, Chronic pain syndrome, Elevated sed rate, and Elevated C-reactive protein (CRP) were also pertinent to this visit.  Status Diagnosis  Controlled Controlled Controlled 1. Cervicalgia   2. Chronic upper back pain (Midline)   3. Chronic thoracic back pain (Midline)   4. Cervical radiculitis (C5) (Left)   5. Cervical spondylosis without myelopathy (C5-6)   6. DDD (degenerative disc disease), cervical   7. Chronic pain syndrome   8. Elevated sed rate    9. Elevated C-reactive protein (CRP)     Problems updated and reviewed during this visit: No problems updated. Plan of Care  Pharmacotherapy (Medications Ordered): Meds ordered this encounter  Medications  . meloxicam (MOBIC) 15 MG tablet    Sig: Take 1 tablet (15 mg total) by mouth daily.    Dispense:  30 tablet    Refill:  5    Do not add this medication to the electronic "Automatic Refill" notification system. Patient may have prescription filled one day early if pharmacy is closed on scheduled refill date.   Medications administered today: Shawn Quinn had no medications administered during this visit.   Procedure Orders     Cervical Epidural Injection  Lab Orders     ANA w/Reflex if Positive     Rheumatoid factor Imaging Orders  No imaging studies ordered today   Referral Orders  No referral(s) requested today    Interventional management options: Planned, scheduled, and/or pending:   Diagnostic Left Cervical ESI #2 under fluoroscopic guidance, no sedation (PRN)   Considering:   Diagnostic left Cervical ESI #2  Diagnostic trigger point injections  Diagnostic midline thoracic epidural steroid injection  Diagnostic midline thoracic facet nerve block    Palliative PRN treatment(s):   Palliative Left Cervical ESI    Provider-requested follow-up: Return if symptoms worsen  or fail to improve, for PRN Procedure: (L) CESI #2.  No future appointments. Primary Care Physician: Valerie Roys, DO Location: [facility] Note by: Gaspar Cola, MD Date: 10/11/2017; Time: 10:21 AM

## 2017-10-11 ENCOUNTER — Ambulatory Visit: Payer: 59 | Attending: Pain Medicine | Admitting: Pain Medicine

## 2017-10-11 ENCOUNTER — Other Ambulatory Visit: Payer: Self-pay

## 2017-10-11 ENCOUNTER — Encounter: Payer: Self-pay | Admitting: Pain Medicine

## 2017-10-11 VITALS — BP 134/79 | HR 75 | Temp 98.1°F | Ht 67.0 in | Wt 210.0 lb

## 2017-10-11 DIAGNOSIS — R700 Elevated erythrocyte sedimentation rate: Secondary | ICD-10-CM | POA: Diagnosis not present

## 2017-10-11 DIAGNOSIS — M546 Pain in thoracic spine: Secondary | ICD-10-CM | POA: Insufficient documentation

## 2017-10-11 DIAGNOSIS — M5412 Radiculopathy, cervical region: Secondary | ICD-10-CM

## 2017-10-11 DIAGNOSIS — Z79899 Other long term (current) drug therapy: Secondary | ICD-10-CM | POA: Diagnosis not present

## 2017-10-11 DIAGNOSIS — M4722 Other spondylosis with radiculopathy, cervical region: Secondary | ICD-10-CM | POA: Insufficient documentation

## 2017-10-11 DIAGNOSIS — M791 Myalgia, unspecified site: Secondary | ICD-10-CM | POA: Diagnosis not present

## 2017-10-11 DIAGNOSIS — M542 Cervicalgia: Secondary | ICD-10-CM | POA: Diagnosis not present

## 2017-10-11 DIAGNOSIS — Z720 Tobacco use: Secondary | ICD-10-CM | POA: Diagnosis not present

## 2017-10-11 DIAGNOSIS — M549 Dorsalgia, unspecified: Secondary | ICD-10-CM

## 2017-10-11 DIAGNOSIS — M47812 Spondylosis without myelopathy or radiculopathy, cervical region: Secondary | ICD-10-CM

## 2017-10-11 DIAGNOSIS — M5010 Cervical disc disorder with radiculopathy, unspecified cervical region: Secondary | ICD-10-CM | POA: Insufficient documentation

## 2017-10-11 DIAGNOSIS — E559 Vitamin D deficiency, unspecified: Secondary | ICD-10-CM | POA: Diagnosis not present

## 2017-10-11 DIAGNOSIS — M5030 Other cervical disc degeneration, unspecified cervical region: Secondary | ICD-10-CM

## 2017-10-11 DIAGNOSIS — R7982 Elevated C-reactive protein (CRP): Secondary | ICD-10-CM | POA: Diagnosis not present

## 2017-10-11 DIAGNOSIS — G8929 Other chronic pain: Secondary | ICD-10-CM

## 2017-10-11 DIAGNOSIS — G894 Chronic pain syndrome: Secondary | ICD-10-CM | POA: Diagnosis not present

## 2017-10-11 MED ORDER — MELOXICAM 15 MG PO TABS: 15.0000 mg | ORAL_TABLET | Freq: Every day | ORAL | 5 refills | Status: DC

## 2017-10-11 NOTE — Progress Notes (Signed)
Went to ED on 10/05/17 for laceration of right hand. He cut himself with a knife, hands was repaired with stitches.

## 2017-10-11 NOTE — Patient Instructions (Addendum)
____________________________________________________________________________________________  Preparing for Procedure with Sedation  Instructions: . Oral Intake: Do not eat or drink anything for at least 8 hours prior to your procedure. . Transportation: Public transportation is not allowed. Bring an adult driver. The driver must be physically present in our waiting room before any procedure can be started. . Physical Assistance: Bring an adult physically capable of assisting you, in the event you need help. This adult should keep you company at home for at least 6 hours after the procedure. . Blood Pressure Medicine: Take your blood pressure medicine with a sip of water the morning of the procedure. . Blood thinners: Notify our staff if you are taking any blood thinners. Depending on which one you take, there will be specific instructions on how and when to stop it. . Diabetics on insulin: Notify the staff so that you can be scheduled 1st case in the morning. If your diabetes requires high dose insulin, take only  of your normal insulin dose the morning of the procedure and notify the staff that you have done so. . Preventing infections: Shower with an antibacterial soap the morning of your procedure. . Build-up your immune system: Take 1000 mg of Vitamin C with every meal (3 times a day) the day prior to your procedure. . Antibiotics: Inform the staff if you have a condition or reason that requires you to take antibiotics before dental procedures. . Pregnancy: If you are pregnant, call and cancel the procedure. . Sickness: If you have a cold, fever, or any active infections, call and cancel the procedure. . Arrival: You must be in the facility at least 30 minutes prior to your scheduled procedure. . Children: Do not bring children with you. . Dress appropriately: Bring dark clothing that you would not mind if they get stained. . Valuables: Do not bring any jewelry or valuables.  Procedure  appointments are reserved for interventional treatments only. . No Prescription Refills. . No medication changes will be discussed during procedure appointments. . No disability issues will be discussed.  Reasons to call and reschedule or cancel your procedure: (Following these recommendations will minimize the risk of a serious complication.) . Surgeries: Avoid having procedures within 2 weeks of any surgery. (Avoid for 2 weeks before or after any surgery). . Flu Shots: Avoid having procedures within 2 weeks of a flu shots or . (Avoid for 2 weeks before or after immunizations). . Barium: Avoid having a procedure within 7-10 days after having had a radiological study involving the use of radiological contrast. (Myelograms, Barium swallow or enema study). . Heart attacks: Avoid any elective procedures or surgeries for the initial 6 months after a "Myocardial Infarction" (Heart Attack). . Blood thinners: It is imperative that you stop these medications before procedures. Let us know if you if you take any blood thinner.  . Infection: Avoid procedures during or within two weeks of an infection (including chest colds or gastrointestinal problems). Symptoms associated with infections include: Localized redness, fever, chills, night sweats or profuse sweating, burning sensation when voiding, cough, congestion, stuffiness, runny nose, sore throat, diarrhea, nausea, vomiting, cold or Flu symptoms, recent or current infections. It is specially important if the infection is over the area that we intend to treat. . Heart and lung problems: Symptoms that may suggest an active cardiopulmonary problem include: cough, chest pain, breathing difficulties or shortness of breath, dizziness, ankle swelling, uncontrolled high or unusually low blood pressure, and/or palpitations. If you are experiencing any of these symptoms, cancel   your procedure and contact your primary care physician for an evaluation.  Remember:   Regular Business hours are:  Monday to Thursday 8:00 AM to 4:00 PM  Provider's Schedule: Rankin Coolman, MD:  Procedure days: Tuesday and Thursday 7:30 AM to 4:00 PM  Bilal Lateef, MD:  Procedure days: Monday and Wednesday 7:30 AM to 4:00 PM ____________________________________________________________________________________________   ____________________________________________________________________________________________  Pain Prevention Technique  Definition:   A technique used to minimize the effects of an activity known to cause inflammation or swelling, which in turn leads to an increase in pain.  Purpose: To prevent swelling from occurring. It is based on the fact that it is easier to prevent swelling from happening than it is to get rid of it, once it occurs.  Contraindications: 1. Anyone with allergy or hypersensitivity to the recommended medications. 2. Anyone taking anticoagulants (Blood Thinners) (e.g., Coumadin, Warfarin, Plavix, etc.). 3. Patients in Renal Failure.  Technique: Before you undertake an activity known to cause pain, or a flare-up of your chronic pain, and before you experience any pain, do the following:  1. On a full stomach, take 4 (four) over the counter Ibuprofens 200mg tablets (Motrin), for a total of 800 mg. 2. In addition, take over the counter Magnesium 400 to 500 mg, before doing the activity.  3. Six (6) hours later, again on a full stomach, repeat the Ibuprofen. 4. That night, take a warm shower and stretch under the running warm water.  This technique may be sufficient to abort the pain and discomfort before it happens. Keep in mind that it takes a lot less medication to prevent swelling than it takes to eliminate it once it occurs.  ____________________________________________________________________________________________    

## 2017-10-12 LAB — RHEUMATOID FACTOR: Rhuematoid fact SerPl-aCnc: <10 IU/mL (ref 0.0–13.9)

## 2017-10-12 LAB — ANA W/REFLEX IF POSITIVE: ANA: NEGATIVE

## 2017-10-13 ENCOUNTER — Encounter: Payer: Self-pay | Admitting: Family Medicine

## 2017-10-13 ENCOUNTER — Other Ambulatory Visit: Payer: Self-pay

## 2017-10-13 ENCOUNTER — Ambulatory Visit (INDEPENDENT_AMBULATORY_CARE_PROVIDER_SITE_OTHER): Payer: 59 | Admitting: Family Medicine

## 2017-10-13 VITALS — BP 138/92 | HR 91 | Temp 98.6°F | Ht 67.0 in | Wt 216.0 lb

## 2017-10-13 DIAGNOSIS — Z23 Encounter for immunization: Secondary | ICD-10-CM

## 2017-10-13 DIAGNOSIS — S61412A Laceration without foreign body of left hand, initial encounter: Secondary | ICD-10-CM

## 2017-10-13 NOTE — Progress Notes (Signed)
   BP (!) 138/92   Pulse 91   Temp 98.6 F (37 C) (Oral)   Ht 5\' 7"  (1.702 m)   Wt 216 lb (98 kg)   SpO2 96%   BMI 33.83 kg/m    Subjective:    Patient ID: Shawn Quinn, male    DOB: 04/29/1980, 37 y.o.   MRN: [id number]  HPI: Shawn Quinn is a 37 y.o. male  Chief Complaint  Patient presents with  . Stitch revomal    left hand   ER FOLLOW UP Time since discharge: 9 days Hospital/facility: [facility] Diagnosis: laceration of his hand Procedures/tests: sutures (3) Consultants: None New medications:  Neosporin Discharge instructions:  Return here for suture removal Status: better  Otherwise feeling well. Back is feeling much better after his injection.   Relevant past medical, surgical, family and social history reviewed and updated as indicated. Interim medical history since our last visit reviewed. Allergies and medications reviewed and updated.  Review of Systems  Constitutional: Negative.   Respiratory: Negative.   Cardiovascular: Negative.   Skin: Positive for wound. Negative for color change, pallor and rash.  Psychiatric/Behavioral: Negative.     Per HPI unless specifically indicated above     Objective:    BP (!) 138/92   Pulse 91   Temp 98.6 F (37 C) (Oral)   Ht 5\' 7"  (1.702 m)   Wt 216 lb (98 kg)   SpO2 96%   BMI 33.83 kg/m   Wt Readings from Last 3 Encounters:  10/13/17 216 lb (98 kg)  10/11/17 210 lb (95.3 kg)  10/05/17 209 lb (94.8 kg)    Physical Exam  Constitutional: He is oriented to person, place, and time. He appears well-developed and well-nourished. No distress.  HENT:  Head: Normocephalic and atraumatic.  Right Ear: Hearing normal.  Left Ear: Hearing normal.  Nose: Nose normal.  Eyes: Conjunctivae and lids are normal. Right eye exhibits no discharge. Left eye exhibits no discharge. No scleral icterus.  Pulmonary/Chest: Effort normal. No respiratory distress.  Musculoskeletal: Normal range of motion.  Neurological: He is alert and  oriented to person, place, and time.  Skin: Skin is warm, dry and intact. Capillary refill takes less than 2 seconds. No rash noted. He is not diaphoretic. No erythema. No pallor.  Laceration healing well with 3 sutures on L thumb base  Psychiatric: He has a normal mood and affect. His speech is normal and behavior is normal. Judgment and thought content normal. Cognition and memory are normal.    Results for orders placed or performed in visit on 10/11/17  ANA w/Reflex if Positive  Result Value Ref Range   Anti Nuclear Antibody(ANA) Negative Negative  Rheumatoid factor  Result Value Ref Range   Rhuematoid fact SerPl-aCnc <10.0 0.0 - 13.9 IU/mL      Assessment & Plan:   Problem List Items Addressed This Visit    None    Visit Diagnoses    Laceration of left hand, foreign body presence unspecified, initial encounter    -  Primary   Well healed. Sutures removed. Steri-strips placed. Call with any concerns.    Flu vaccine need       Flu shot given today.   Relevant Orders   Flu Vaccine QUAD 36+ mos IM       Follow up plan: Return if symptoms worsen or fail to improve.

## 2017-10-23 DIAGNOSIS — G4733 Obstructive sleep apnea (adult) (pediatric): Secondary | ICD-10-CM | POA: Diagnosis not present

## 2017-11-20 DIAGNOSIS — G4733 Obstructive sleep apnea (adult) (pediatric): Secondary | ICD-10-CM | POA: Diagnosis not present

## 2017-11-20 DIAGNOSIS — R030 Elevated blood-pressure reading, without diagnosis of hypertension: Secondary | ICD-10-CM | POA: Diagnosis not present

## 2017-11-20 DIAGNOSIS — K219 Gastro-esophageal reflux disease without esophagitis: Secondary | ICD-10-CM | POA: Diagnosis not present

## 2017-11-22 DIAGNOSIS — G4733 Obstructive sleep apnea (adult) (pediatric): Secondary | ICD-10-CM | POA: Diagnosis not present

## 2017-12-06 ENCOUNTER — Ambulatory Visit (INDEPENDENT_AMBULATORY_CARE_PROVIDER_SITE_OTHER): Payer: 59 | Admitting: Family Medicine

## 2017-12-06 ENCOUNTER — Encounter: Payer: Self-pay | Admitting: Family Medicine

## 2017-12-06 VITALS — BP 139/81 | HR 84 | Temp 98.4°F | Ht 67.0 in | Wt 224.1 lb

## 2017-12-06 DIAGNOSIS — R1310 Dysphagia, unspecified: Secondary | ICD-10-CM

## 2017-12-06 DIAGNOSIS — Z23 Encounter for immunization: Secondary | ICD-10-CM | POA: Diagnosis not present

## 2017-12-06 NOTE — Progress Notes (Signed)
BP 139/81 (BP Location: Left Arm, Patient Position: Sitting, Cuff Size: Large)   Pulse 84   Temp 98.4 F (36.9 C)   Ht 5\' 7"  (1.702 m)   Wt 224 lb 1 oz (101.6 kg)   SpO2 97%   BMI 35.09 kg/m    Subjective:    Patient ID: Shawn Quinn, male    DOB: [birth year], 37 y.o.   MRN: [id number]  HPI: Shawn Quinn is a 37 y.o. male  Chief Complaint  Patient presents with  . Dysphagia   DYSPHAGIA Duration: since the beginning of the year Description of symptom: stuck in his chest Onset: Several seconds after swallowing Location of dysphagia: chest Dysphagia to solids only: yes Dysphagia to solids & liquids: no  Frequency: lunch or dinner  Progressively getting worse: yes Alleviatiating factors: vomiting Provoking factors: certain foods Status: worse EGD: no Weight loss: no Sensation of lump in throat: no Heartburn: no Odynophagia: no Nausea: no Vomiting: yes Drooling/nasal regurgitation/food spillage: no Coughing/choking/dysphonia: no Dysarthria: no Hematemesis: no Regurgitation of undigested food/halitosis: yes Chest pain: no  Relevant past medical, surgical, family and social history reviewed and updated as indicated. Interim medical history since our last visit reviewed. Allergies and medications reviewed and updated.  Review of Systems  Constitutional: Negative.   Respiratory: Negative.   Cardiovascular: Negative.   Gastrointestinal: Positive for vomiting. Negative for abdominal distention, abdominal pain, anal bleeding, blood in stool, constipation, diarrhea, nausea and rectal pain.  Skin: Negative.   Psychiatric/Behavioral: Negative.     Per HPI unless specifically indicated above     Objective:    BP 139/81 (BP Location: Left Arm, Patient Position: Sitting, Cuff Size: Large)   Pulse 84   Temp 98.4 F (36.9 C)   Ht 5\' 7"  (1.702 m)   Wt 224 lb 1 oz (101.6 kg)   SpO2 97%   BMI 35.09 kg/m   Wt Readings from Last 3 Encounters:  12/06/17 224 lb 1 oz  (101.6 kg)  10/13/17 216 lb (98 kg)  10/11/17 210 lb (95.3 kg)    Physical Exam  Constitutional: He is oriented to person, place, and time. He appears well-developed and well-nourished. No distress.  HENT:  Head: Normocephalic and atraumatic.  Right Ear: Hearing normal.  Left Ear: Hearing normal.  Nose: Nose normal.  Eyes: Conjunctivae and lids are normal. Right eye exhibits no discharge. Left eye exhibits no discharge. No scleral icterus.  Cardiovascular: Normal rate, regular rhythm, normal heart sounds and intact distal pulses. Exam reveals no gallop and no friction rub.  No murmur heard. Pulmonary/Chest: Effort normal and breath sounds normal. No stridor. No respiratory distress. He has no wheezes. He has no rales. He exhibits no tenderness.  Musculoskeletal: Normal range of motion.  Neurological: He is alert and oriented to person, place, and time.  Skin: Skin is warm, dry and intact. Capillary refill takes less than 2 seconds. No rash noted. He is not diaphoretic. No erythema. No pallor.  Psychiatric: He has a normal mood and affect. His speech is normal and behavior is normal. Judgment and thought content normal. Cognition and memory are normal.  Nursing note and vitals reviewed.   Results for orders placed or performed in visit on 10/11/17  ANA w/Reflex if Positive  Result Value Ref Range   Anti Nuclear Antibody(ANA) Negative Negative  Rheumatoid factor  Result Value Ref Range   Rhuematoid fact SerPl-aCnc <10.0 0.0 - 13.9 IU/mL      Assessment & Plan:   Problem List  Items Addressed This Visit    None    Visit Diagnoses    Dysphagia, unspecified type    -  Primary   Will refer to GI. Call with any concerns. Continue to monitor.    Relevant Orders   Ambulatory referral to Gastroenterology   Immunization due  (Chronic)      Flu shot given today.   Relevant Orders   Flu Vaccine QUAD 6+ mos PF IM (Fluarix Quad PF) (Completed)       Follow up plan: Return if symptoms  worsen or fail to improve.

## 2017-12-06 NOTE — Patient Instructions (Signed)

## 2017-12-21 ENCOUNTER — Other Ambulatory Visit: Payer: Self-pay

## 2017-12-21 ENCOUNTER — Ambulatory Visit
Admission: RE | Admit: 2017-12-21 | Discharge: 2017-12-21 | Disposition: A | Discharge status: Home / Self Care | Payer: 59 | Source: Ambulatory Visit | Attending: Pain Medicine | Admitting: Pain Medicine

## 2017-12-21 ENCOUNTER — Encounter: Payer: Self-pay | Admitting: Pain Medicine

## 2017-12-21 ENCOUNTER — Ambulatory Visit (HOSPITAL_BASED_OUTPATIENT_CLINIC_OR_DEPARTMENT_OTHER): Payer: 59 | Admitting: Pain Medicine

## 2017-12-21 VITALS — BP 140/91 | HR 77 | Temp 98.1°F | Resp 15 | Ht 67.0 in | Wt 214.0 lb

## 2017-12-21 DIAGNOSIS — M546 Pain in thoracic spine: Secondary | ICD-10-CM | POA: Diagnosis present

## 2017-12-21 DIAGNOSIS — M5030 Other cervical disc degeneration, unspecified cervical region: Secondary | ICD-10-CM | POA: Diagnosis not present

## 2017-12-21 DIAGNOSIS — M50122 Cervical disc disorder at C5-C6 level with radiculopathy: Secondary | ICD-10-CM | POA: Insufficient documentation

## 2017-12-21 DIAGNOSIS — M5412 Radiculopathy, cervical region: Secondary | ICD-10-CM

## 2017-12-21 DIAGNOSIS — M4722 Other spondylosis with radiculopathy, cervical region: Secondary | ICD-10-CM | POA: Diagnosis not present

## 2017-12-21 DIAGNOSIS — M542 Cervicalgia: Secondary | ICD-10-CM

## 2017-12-21 DIAGNOSIS — M47812 Spondylosis without myelopathy or radiculopathy, cervical region: Secondary | ICD-10-CM

## 2017-12-21 MED ORDER — IOPAMIDOL (ISOVUE-M 200) INJECTION 41%
10.0000 mL | Freq: Once | INTRAMUSCULAR | Status: AC
  Administered 2017-12-21: 10 mL via EPIDURAL
  Filled 2017-12-21: qty 10

## 2017-12-21 MED ORDER — DEXAMETHASONE SODIUM PHOSPHATE 10 MG/ML IJ SOLN
10.0000 mg | Freq: Once | INTRAMUSCULAR | Status: AC
  Administered 2017-12-21: 10 mg
  Filled 2017-12-21: qty 1

## 2017-12-21 MED ORDER — SODIUM CHLORIDE 0.9% FLUSH
1.0000 mL | Freq: Once | INTRAVENOUS | Status: AC
  Administered 2017-12-21: 1 mL

## 2017-12-21 MED ORDER — LIDOCAINE HCL 2 % IJ SOLN
20.0000 mL | Freq: Once | INTRAMUSCULAR | Status: AC
  Administered 2017-12-21: 400 mg
  Filled 2017-12-21: qty 40

## 2017-12-21 MED ORDER — ROPIVACAINE HCL 2 MG/ML IJ SOLN
1.0000 mL | Freq: Once | INTRAMUSCULAR | Status: AC
  Administered 2017-12-21: 1 mL via EPIDURAL
  Filled 2017-12-21: qty 10

## 2017-12-21 NOTE — Patient Instructions (Addendum)
____________________________________________________________________________________________  Post-Procedure Discharge Instructions  Instructions:  Apply ice: Fill a plastic sandwich bag with crushed ice. Cover it with a small towel and apply to injection site. Apply for 15 minutes then remove x 15 minutes. Repeat sequence on day of procedure, until you go to bed. The purpose is to minimize swelling and discomfort after procedure.  Apply heat: Apply heat to procedure site starting the day following the procedure. The purpose is to treat any soreness and discomfort from the procedure.  Food intake: Start with clear liquids (like water) and advance to regular food, as tolerated.   Physical activities: Keep activities to a minimum for the first 8 hours after the procedure.   Driving: If you have received any sedation, you are not allowed to drive for 24 hours after your procedure.  Blood thinner: Restart your blood thinner 6 hours after your procedure. (Only for those taking blood thinners)  Insulin: As soon as you can eat, you may resume your normal dosing schedule. (Only for those taking insulin)  Infection prevention: Keep procedure site clean and dry.  Post-procedure Pain Diary: Extremely important that this be done correctly and accurately. Recorded information will be used to determine the next step in treatment.  Pain evaluated is that of treated area only. Do not include pain from an untreated area.  Complete every hour, on the hour, for the initial 8 hours. Set an alarm to help you do this part accurately.  Do not go to sleep and have it completed later. It will not be accurate.  Follow-up appointment: Keep your follow-up appointment after the procedure. Usually 2 weeks for most procedures. (6 weeks in the case of radiofrequency.) Bring you pain diary.   Expect:  From numbing medicine (AKA: Local Anesthetics): Numbness or decrease in pain.  Onset: Full effect within 15  minutes of injected.  Duration: It will depend on the type of local anesthetic used. On the average, 1 to 8 hours.   From steroids: Decrease in swelling or inflammation. Once inflammation is improved, relief of the pain will follow.  Onset of benefits: Depends on the amount of swelling present. The more swelling, the longer it will take for the benefits to be seen. In some cases, up to 10 days.  Duration: Steroids will stay in the system x 2 weeks. Duration of benefits will depend on multiple posibilities including persistent irritating factors.  Occasional side-effects: Facial flushing (red, warm cheeks) , cramps (if present, drink Gatorade and take over-the-counter Magnesium 450-500 mg once to twice a day).  From procedure: Some discomfort is to be expected once the numbing medicine wears off. This should be minimal if ice and heat are applied as instructed.  Call if:  You experience numbness and weakness that gets worse with time, as opposed to wearing off.  New onset bowel or bladder incontinence. (This applies to Spinal procedures only)  Emergency Numbers:  Durning business hours (Monday - Thursday, 8:00 AM - 4:00 PM) (Friday, 9:00 AM - 12:00 Noon): [phone]  After hours: [phone] ____________________________________________________________________________________________   Pain Management Discharge Instructions  General Discharge Instructions :  If you need to reach your doctor call: Monday-Friday 8:00 am - 4:00 pm at [phone] or toll free [phone].  After clinic hours [phone] to have operator reach doctor.  Bring all of your medication bottles to all your appointments in the pain clinic.  To cancel or reschedule your appointment with Pain Management please remember to call 24 hours in advance to   avoid a fee.  Refer to the educational materials which you have been given on: General Risks, I had my Procedure. Discharge Instructions, Post  Sedation.  Post Procedure Instructions:  The drugs you were given will stay in your system until tomorrow, so for the next 24 hours you should not drive, make any legal decisions or drink any alcoholic beverages.  You may eat anything you prefer, but it is better to start with liquids then soups and crackers, and gradually work up to solid foods.  Please notify your doctor immediately if you have any unusual bleeding, trouble breathing or pain that is not related to your normal pain.  Depending on the type of procedure that was done, some parts of your body may feel week and/or numb.  This usually clears up by tonight or the next day.  Walk with the use of an assistive device or accompanied by an adult for the 24 hours.  You may use ice on the affected area for the first 24 hours.  Put ice in a Ziploc bag and cover with a towel and place against area 15 minutes on 15 minutes off.  You may switch to heat after 24 hours.Epidural Steroid Injection Patient Information  Description: The epidural space surrounds the nerves as they exit the spinal cord.  In some patients, the nerves can be compressed and inflamed by a bulging disc or a tight spinal canal (spinal stenosis).  By injecting steroids into the epidural space, we can bring irritated nerves into direct contact with a potentially helpful medication.  These steroids act directly on the irritated nerves and can reduce swelling and inflammation which often leads to decreased pain.  Epidural steroids may be injected anywhere along the spine and from the neck to the low back depending upon the location of your pain.   After numbing the skin with local anesthetic (like Novocaine), a small needle is passed into the epidural space slowly.  You may experience a sensation of pressure while this is being done.  The entire block usually last less than 10 minutes.  Conditions which may be treated by epidural steroids:   Low back and leg pain  Neck and  arm pain  Spinal stenosis  Post-laminectomy syndrome  Herpes zoster (shingles) pain  Pain from compression fractures  Preparation for the injection:  1. Do not eat any solid food or dairy products within 8 hours of your appointment.  2. You may drink clear liquids up to 3 hours before appointment.  Clear liquids include water, black coffee, juice or soda.  No milk or cream please. 3. You may take your regular medication, including pain medications, with a sip of water before your appointment  Diabetics should hold regular insulin (if taken separately) and take 1/2 normal NPH dos the morning of the procedure.  Carry some sugar containing items with you to your appointment. 4. A driver must accompany you and be prepared to drive you home after your procedure.  5. Bring all your current medications with your. 6. An IV may be inserted and sedation may be given at the discretion of the physician.   7. A blood pressure cuff, EKG and other monitors will often be applied during the procedure.  Some patients may need to have extra oxygen administered for a short period. 8. You will be asked to provide medical information, including your allergies, prior to the procedure.  We must know immediately if you are taking blood thinners (like Coumadin/Warfarin)  Or   if you are allergic to IV iodine contrast (dye). We must know if you could possible be pregnant.  Possible side-effects:  Bleeding from needle site  Infection (rare, may require surgery)  Nerve injury (rare)  Numbness & tingling (temporary)  Difficulty urinating (rare, temporary)  Spinal headache ( a headache worse with upright posture)  Light -headedness (temporary)  Pain at injection site (several days)  Decreased blood pressure (temporary)  Weakness in arm/leg (temporary)  Pressure sensation in back/neck (temporary)  Call if you experience:  Fever/chills associated with headache or increased back/neck pain.  Headache  worsened by an upright position.  New onset weakness or numbness of an extremity below the injection site  Hives or difficulty breathing (go to the emergency room)  Inflammation or drainage at the infection site  Severe back/neck pain  Any new symptoms which are concerning to you  Please note:  Although the local anesthetic injected can often make your back or neck feel good for several hours after the injection, the pain will likely return.  It takes 3-7 days for steroids to work in the epidural space.  You may not notice any pain relief for at least that one week.  If effective, we will often do a series of three injections spaced 3-6 weeks apart to maximally decrease your pain.  After the initial series, we generally will wait several months before considering a repeat injection of the same type.  If you have any questions, please call [phone] [facility] 

## 2017-12-21 NOTE — Progress Notes (Signed)
Safety precautions to be maintained throughout the outpatient stay will include: orient to surroundings, keep bed in low position, maintain call bell within reach at all times, provide assistance with transfer out of bed and ambulation.  

## 2017-12-21 NOTE — Progress Notes (Signed)
Patient's Name: Shawn Quinn  MRN: [id number]  Referring Provider: Delano Metz, MD  DOB: 09-Mar-1980  PCP: Dorcas Carrow, DO  DOS: 12/21/2017  Note by: Oswaldo Done, MD  Service setting: Ambulatory outpatient  Specialty: Interventional Pain Management  Patient type: Established  Location: [facility] (AMB) Pain Management Facility  Visit type: Interventional Procedure   Primary Reason for Visit: Interventional Pain Management Treatment. CC: Back Pain (upper)  Procedure:          Anesthesia, Analgesia, Anxiolysis:  Type: Diagnostic, Inter-Laminar, Epidural Steroid Injection  #2  Region: Posterior Cervico-thoracic Region Level: C7-T1 Laterality: Left-Sided Paramedial  Type: Local Anesthesia Indication(s): Analgesia         Route: Infiltration (Homer/IM) IV Access: Declined Sedation: Declined  Local Anesthetic: Lidocaine 1-2%  Position: Prone with head of the table was raised to facilitate breathing.   Indications: 1. DDD (degenerative disc disease), cervical   2. Cervical radiculitis (C5) (Left)   3. Cervicalgia   4. Cervical spondylosis without myelopathy (C5-6)    Pain Score: Pre-procedure: 1 /10 Post-procedure: 1 /10  Pre-op Assessment:  Shawn Quinn is a 37 y.o. (year old), male patient, seen today for interventional treatment. He  has a past surgical history that includes Wisdom tooth extraction (37yo) and Wisdom tooth extraction (Bilateral, 2002). Mr. Lunn has a current medication list which includes the following prescription(s): vitamin d3, gabapentin, ibuprofen, loratadine, magnesium, meloxicam, multiple vitamin, and tizanidine. His primarily concern today is the Back Pain (upper)  Initial Vital Signs:  Pulse/HCG Rate: 77ECG Heart Rate: 85 Temp: 98.1 F (36.7 C) Resp: 16 BP: (!) 146/91 SpO2: 96 %  BMI: Estimated body mass index is 33.52 kg/m as calculated from the following:   Height as of this encounter: 5\' 7"  (1.702 m).   Weight as of this encounter: 214 lb  (97.1 kg).  Risk Assessment: Allergies: Reviewed. He has No Known Allergies.  Allergy Precautions: None required Coagulopathies: Reviewed. None identified.  Blood-thinner therapy: None at this time Active Infection(s): Reviewed. None identified. Shawn Quinn is afebrile  Site Confirmation: Shawn Quinn was asked to confirm the procedure and laterality before marking the site Procedure checklist: Completed Consent: Before the procedure and under the influence of no sedative(s), amnesic(s), or anxiolytics, the patient was informed of the treatment options, risks and possible complications. To fulfill our ethical and legal obligations, as recommended by the American Medical Association's Code of Ethics, I have informed the patient of my clinical impression; the nature and purpose of the treatment or procedure; the risks, benefits, and possible complications of the intervention; the alternatives, including doing nothing; the risk(s) and benefit(s) of the alternative treatment(s) or procedure(s); and the risk(s) and benefit(s) of doing nothing. The patient was provided information about the general risks and possible complications associated with the procedure. These may include, but are not limited to: failure to achieve desired goals, infection, bleeding, organ or nerve damage, allergic reactions, paralysis, and death. In addition, the patient was informed of those risks and complications associated to Spine-related procedures, such as failure to decrease pain; infection (i.e.: Meningitis, epidural or intraspinal abscess); bleeding (i.e.: epidural hematoma, subarachnoid hemorrhage, or any other type of intraspinal or peri-dural bleeding); organ or nerve damage (i.e.: Any type of peripheral nerve, nerve root, or spinal cord injury) with subsequent damage to sensory, motor, and/or autonomic systems, resulting in permanent pain, numbness, and/or weakness of one or several areas of the body; allergic reactions;  (i.e.: anaphylactic reaction); and/or death. Furthermore, the patient was informed of  those risks and complications associated with the medications. These include, but are not limited to: allergic reactions (i.e.: anaphylactic or anaphylactoid reaction(s)); adrenal axis suppression; blood sugar elevation that in diabetics may result in ketoacidosis or comma; water retention that in patients with history of congestive heart failure may result in shortness of breath, pulmonary edema, and decompensation with resultant heart failure; weight gain; swelling or edema; medication-induced neural toxicity; particulate matter embolism and blood vessel occlusion with resultant organ, and/or nervous system infarction; and/or aseptic necrosis of one or more joints. Finally, the patient was informed that Medicine is not an exact science; therefore, there is also the possibility of unforeseen or unpredictable risks and/or possible complications that may result in a catastrophic outcome. The patient indicated having understood very clearly. We have given the patient no guarantees and we have made no promises. Enough time was given to the patient to ask questions, all of which were answered to the patient's satisfaction. Shawn Quinn has indicated that he wanted to continue with the procedure. Attestation: I, the ordering provider, attest that I have discussed with the patient the benefits, risks, side-effects, alternatives, likelihood of achieving goals, and potential problems during recovery for the procedure that I have provided informed consent. Date  Time: 12/21/2017  9:36 AM  Pre-Procedure Preparation:  Monitoring: As per clinic protocol. Respiration, ETCO2, SpO2, BP, heart rate and rhythm monitor placed and checked for adequate function Safety Precautions: Patient was assessed for positional comfort and pressure points before starting the procedure. Time-out: I initiated and conducted the "Time-out" before starting the  procedure, as per protocol. The patient was asked to participate by confirming the accuracy of the "Time Out" information. Verification of the correct person, site, and procedure were performed and confirmed by me, the nursing staff, and the patient. "Time-out" conducted as per Joint Commission's Universal Protocol (UP.01.01.01). Time: 1012  Description of Procedure:          Target Area: For Epidural Steroid injections the target is the interlaminar space, initially targeting the lower border of the superior vertebral body lamina. Approach: Paramedial approach. Area Prepped: Entire PosteriorCervical Region Prepping solution: ChloraPrep (2% chlorhexidine gluconate and 70% isopropyl alcohol) Safety Precautions: Aspiration looking for blood return was conducted prior to all injections. At no point did we inject any substances, as a needle was being advanced. No attempts were made at seeking any paresthesias. Safe injection practices and needle disposal techniques used. Medications properly checked for expiration dates. SDV (single dose vial) medications used. Description of the Procedure: Protocol guidelines were followed. The procedure needle was introduced through the skin, ipsilateral to the reported pain, and advanced to the target area. Bone was contacted and the needle walked caudad, until the lamina was cleared. The epidural space was identified using "loss-of-resistance technique" with 2-3 ml of PF-NaCl (0.9% NSS), in a 5cc LOR glass syringe. Vitals:   12/21/17 0935 12/21/17 1010 12/21/17 1015 12/21/17 1017  BP: (!) 146/91 (!) 136/94 (!) 123/93 (!) 140/91  Pulse: 77     Resp: 16 17 16 15   Temp: 98.1 F (36.7 C)     TempSrc: Oral     SpO2: 96% 95% 95% 97%  Weight: 214 lb (97.1 kg)     Height: 5\' 7"  (1.702 m)       Start Time: 1012 hrs. End Time: 1015 hrs. Materials:  Needle(s) Type: Epidural needle Gauge: 17G Length: 3.5-in Medication(s): Please see orders for medications and dosing  details.  Imaging Guidance (Spinal):  Type of Imaging Technique: Fluoroscopy Guidance (Spinal) Indication(s): Assistance in needle guidance and placement for procedures requiring needle placement in or near specific anatomical locations not easily accessible without such assistance. Exposure Time: Please see nurses notes. Contrast: Before injecting any contrast, we confirmed that the patient did not have an allergy to iodine, shellfish, or radiological contrast. Once satisfactory needle placement was completed at the desired level, radiological contrast was injected. Contrast injected under live fluoroscopy. No contrast complications. See chart for type and volume of contrast used. Fluoroscopic Guidance: I was personally present during the use of fluoroscopy. "Tunnel Vision Technique" used to obtain the best possible view of the target area. Parallax error corrected before commencing the procedure. "Direction-depth-direction" technique used to introduce the needle under continuous pulsed fluoroscopy. Once target was reached, antero-posterior, oblique, and lateral fluoroscopic projection used confirm needle placement in all planes. Images permanently stored in EMR. Interpretation: I personally interpreted the imaging intraoperatively. Adequate needle placement confirmed in multiple planes. Appropriate spread of contrast into desired area was observed. No evidence of afferent or efferent intravascular uptake. No intrathecal or subarachnoid spread observed. Permanent images saved into the patient's record.  Antibiotic Prophylaxis:   Anti-infectives (From admission, onward)   None     Indication(s): None identified  Post-operative Assessment:  Post-procedure Vital Signs:  Pulse/HCG Rate: 7780 Temp: 98.1 F (36.7 C) Resp: 15 BP: (!) 140/91 SpO2: 97 %  EBL: None  Complications: No immediate post-treatment complications observed by team, or reported by patient.  Note: The patient  tolerated the entire procedure well. A repeat set of vitals were taken after the procedure and the patient was kept under observation following institutional policy, for this type of procedure. Post-procedural neurological assessment was performed, showing return to baseline, prior to discharge. The patient was provided with post-procedure discharge instructions, including a section on how to identify potential problems. Should any problems arise concerning this procedure, the patient was given instructions to immediately contact us, at any time, without hesitation. In any case, we plan to contact the patient by telephone for a follow-up status report regarding this interventional procedure.  Comments:  No additional relevant information.  Plan of Care    Imaging Orders     DG C-Arm 1-60 Min-No Report  Procedure Orders     Cervical Epidural Injection  Medications ordered for procedure: Meds ordered this encounter  Medications  . iopamidol (ISOVUE-M) 41 % intrathecal injection 10 mL    Must be Myelogram-compatible. If not available, you may substitute with a water-soluble, non-ionic, hypoallergenic, myelogram-compatible radiological contrast medium.  Marland Kitchen lidocaine (XYLOCAINE) 2 % (with pres) injection 400 mg  . sodium chloride flush (NS) 0.9 % injection 1 mL  . ropivacaine (PF) 2 mg/mL (0.2%) (NAROPIN) injection 1 mL  . dexamethasone (DECADRON) injection 10 mg   Medications administered: We administered iopamidol, lidocaine, sodium chloride flush, ropivacaine (PF) 2 mg/mL (0.2%), and dexamethasone.  See the medical record for exact dosing, route, and time of administration.  Disposition: Discharge home  Discharge Date & Time: 12/21/2017; 1025 hrs.   Physician-requested Follow-up: Return for post-procedure eval (2 wks), w/ Dr. Laban Emperor.  Future Appointments  Date Time Provider Department Center  12/25/2017  2:00 PM Dorcas Carrow, [place] [facility]  01/04/2018  2:30 PM Midge Minium, MD  [facility] None  01/10/2018 11:15 AM Delano Metz, MD [facility] None   Primary Care Physician: Dorcas Carrow, DO Location: [facility] Note by: Oswaldo Done, MD Date: 12/21/2017; Time: 10:28  AM  Disclaimer:  Medicine is not an Chief Strategy Officer. The only guarantee in medicine is that nothing is guaranteed. It is important to note that the decision to proceed with this intervention was based on the information collected from the patient. The Data and conclusions were drawn from the patient's questionnaire, the interview, and the physical examination. Because the information was provided in large part by the patient, it cannot be guaranteed that it has not been purposely or unconsciously manipulated. Every effort has been made to obtain as much relevant data as possible for this evaluation. It is important to note that the conclusions that lead to this procedure are derived in large part from the available data. Always take into account that the treatment will also be dependent on availability of resources and existing treatment guidelines, considered by other Pain Management Practitioners as being common knowledge and practice, at the time of the intervention. For Medico-Legal purposes, it is also important to point out that variation in procedural techniques and pharmacological choices are the acceptable norm. The indications, contraindications, technique, and results of the above procedure should only be interpreted and judged by a Board-Certified Interventional Pain Specialist with extensive familiarity and expertise in the same exact procedure and technique.

## 2017-12-22 ENCOUNTER — Telehealth: Payer: Self-pay

## 2017-12-22 NOTE — Telephone Encounter (Signed)
Post procedure phone call.  LM 

## 2017-12-23 DIAGNOSIS — G4733 Obstructive sleep apnea (adult) (pediatric): Secondary | ICD-10-CM | POA: Diagnosis not present

## 2017-12-25 ENCOUNTER — Ambulatory Visit (INDEPENDENT_AMBULATORY_CARE_PROVIDER_SITE_OTHER): Payer: 59 | Admitting: Family Medicine

## 2017-12-25 ENCOUNTER — Encounter: Payer: Self-pay | Admitting: Family Medicine

## 2017-12-25 VITALS — BP 134/91 | HR 84 | Temp 98.4°F | Ht 67.0 in | Wt 221.8 lb

## 2017-12-25 DIAGNOSIS — Z0000 Encounter for general adult medical examination without abnormal findings: Secondary | ICD-10-CM | POA: Diagnosis not present

## 2017-12-25 DIAGNOSIS — M549 Dorsalgia, unspecified: Secondary | ICD-10-CM | POA: Diagnosis not present

## 2017-12-25 DIAGNOSIS — L719 Rosacea, unspecified: Secondary | ICD-10-CM

## 2017-12-25 DIAGNOSIS — G8929 Other chronic pain: Secondary | ICD-10-CM | POA: Diagnosis not present

## 2017-12-25 DIAGNOSIS — R1310 Dysphagia, unspecified: Secondary | ICD-10-CM | POA: Diagnosis not present

## 2017-12-25 LAB — UA/M W/RFLX CULTURE, ROUTINE
Bilirubin, UA: NEGATIVE
GLUCOSE, UA: NEGATIVE
Ketones, UA: NEGATIVE
LEUKOCYTES UA: NEGATIVE
Nitrite, UA: NEGATIVE
PH UA: 7 (ref 5.0–7.5)
PROTEIN UA: NEGATIVE
RBC UA: NEGATIVE
SPEC GRAV UA: 1.02 (ref 1.005–1.030)
Urobilinogen, Ur: 0.2 mg/dL (ref 0.2–1.0)

## 2017-12-25 LAB — LIPID PANEL PICCOLO, WAIVED
CHOL/HDL RATIO PICCOLO,WAIVE: 3.9 mg/dL
Cholesterol Piccolo, Waived: 202 mg/dL — ABNORMAL HIGH (ref <200)
HDL Chol Piccolo, Waived: 52 mg/dL — ABNORMAL LOW (ref >59)
LDL Chol Calc Piccolo Waived: 92 mg/dL (ref <100)
TRIGLYCERIDES PICCOLO,WAIVED: 294 mg/dL — AB (ref <150)
VLDL CHOL CALC PICCOLO,WAIVE: 59 mg/dL — AB (ref <30)

## 2017-12-25 MED ORDER — METRONIDAZOLE 1 % EX GEL: Freq: Every day | CUTANEOUS | 6 refills | Status: AC

## 2017-12-25 NOTE — Assessment & Plan Note (Signed)
Doing better with his ESIs. Continue to follow with pain management. Call with any concerns.

## 2017-12-25 NOTE — Progress Notes (Signed)
BP (!) 134/91 (BP Location: Left Arm, Cuff Size: Normal)   Pulse 84   Temp 98.4 F (36.9 C) (Oral)   Ht 5\' 7"  (1.702 m)   Wt 221 lb 12.8 oz (100.6 kg)   SpO2 96%   BMI 34.74 kg/m    Subjective:    Patient ID: Shawn Quinn, male    DOB: Jun 11, 1980, 37 y.o.   MRN: [id number]  HPI: Shawn Quinn is a 37 y.o. male presenting on 12/25/2017 for comprehensive medical examination. Current medical complaints include:  Had another ESI done. Has really dry skin around his face that is red and irritated. Gets very dry and bad and will burn. Throwing up from dysphagia has gotten worse. Now happening with every meal. Seeing GI on Monday. Otherwise feeling well.   Interim Problems from his last visit: no  Depression Screen done today and results listed below:  Depression screen [facility] 2/9 12/25/2017 12/21/2017 10/11/2017 09/19/2017 09/04/2017  Decreased Interest 0 0 0 1 0  Down, Depressed, Hopeless 0 0 0 1 0  PHQ - 2 Score 0 0 0 2 0  Altered sleeping 1 - 0 2 -  Tired, decreased energy 0 - 0 1 -  Change in appetite 0 - 0 0 -  Feeling bad or failure about yourself  0 - 0 0 -  Trouble concentrating 0 - - - -  Moving slowly or fidgety/restless 0 - - - -  Suicidal thoughts 0 - - - -  PHQ-9 Score 1 - 0 5 -    Past Medical History:  Past Medical History:  Diagnosis Date  . Anxiety   . Depression     Surgical History:  Past Surgical History:  Procedure Laterality Date  . WISDOM TOOTH EXTRACTION  37yo   37yo  . WISDOM TOOTH EXTRACTION Bilateral 2002    Medications:  Current Outpatient Medications on File Prior to Visit  Medication Sig  . Cholecalciferol (VITAMIN D3) 5000 units CAPS Take 1 capsule (5,000 Units total) by mouth daily with breakfast. Take along with calcium and magnesium.  Marland Kitchen gabapentin (NEURONTIN) 100 MG capsule Take 100 mg by mouth daily.  Marland Kitchen ibuprofen (ADVIL,MOTRIN) 600 MG tablet Take 1 tablet (600 mg total) by mouth every 6 (six) hours as needed.  . loratadine (CLARITIN) 10  MG tablet Take 10 mg by mouth daily.  . Magnesium 500 MG CAPS Take 1 capsule (500 mg total) by mouth 2 (two) times daily at 8 am and 10 pm.  . meloxicam (MOBIC) 7.5 MG tablet Take 7.5 mg by mouth daily.  . Multiple Vitamin (CALCIUM COMPLEX PO) Take 100 mg by mouth daily.  Marland Kitchen tiZANidine (ZANAFLEX) 4 MG tablet Take 4 mg by mouth daily.   No current facility-administered medications on file prior to visit.     Allergies:  No Known Allergies  Social History:  Social History   Socioeconomic History  . Marital status: Single    Spouse name: Not on file  . Number of children: Not on file  . Years of education: Not on file  . Highest education level: Not on file  Occupational History  . Not on file  Social Needs  . Financial resource strain: Not on file  . Food insecurity:    Worry: Not on file    Inability: Not on file  . Transportation needs:    Medical: Not on file    Non-medical: Not on file  Tobacco Use  . Smoking status: Former Smoker  Types: Cigarettes  . Smokeless tobacco: Former Neurosurgeon    Types: Chew  Substance and Sexual Activity  . Alcohol use: Yes    Comment: occassional   . Drug use: Never  . Sexual activity: Yes  Lifestyle  . Physical activity:    Days per week: Not on file    Minutes per session: Not on file  . Stress: Not on file  Relationships  . Social connections:    Talks on phone: Not on file    Gets together: Not on file    Attends religious service: Not on file    Active member of club or organization: Not on file    Attends meetings of clubs or organizations: Not on file    Relationship status: Not on file  . Intimate partner violence:    Fear of current or ex partner: Not on file    Emotionally abused: Not on file    Physically abused: Not on file    Forced sexual activity: Not on file  Other Topics Concern  . Not on file  Social History Narrative  . Not on file   Social History   Tobacco Use  Smoking Status Former Smoker  . Types:  Cigarettes  Smokeless Tobacco Former Neurosurgeon  . Types: Chew   Social History   Substance and Sexual Activity  Alcohol Use Yes   Comment: occassional     Family History:  Family History  Problem Relation Age of Onset  . Drug abuse Father   . Multiple sclerosis Father   . Cancer Maternal Grandmother        Breast and lung    Past medical history, surgical history, medications, allergies, family history and social history reviewed with patient today and changes made to appropriate areas of the chart.   Review of Systems  Constitutional: Negative.   HENT: Negative.   Eyes: Negative.   Respiratory: Negative.   Cardiovascular: Negative.   Gastrointestinal: Positive for heartburn, nausea and vomiting. Negative for abdominal pain, blood in stool, constipation, diarrhea and melena.  Genitourinary: Negative.   Musculoskeletal: Positive for back pain and myalgias. Negative for falls, joint pain and neck pain.  Skin: Positive for rash. Negative for itching.  Neurological: Negative.   Endo/Heme/Allergies: Negative.   Psychiatric/Behavioral: Negative for depression, hallucinations, memory loss, substance abuse and suicidal ideas. The patient is nervous/anxious. The patient does not have insomnia.     All other ROS negative except what is listed above and in the HPI.      Objective:    BP (!) 134/91 (BP Location: Left Arm, Cuff Size: Normal)   Pulse 84   Temp 98.4 F (36.9 C) (Oral)   Ht 5\' 7"  (1.702 m)   Wt 221 lb 12.8 oz (100.6 kg)   SpO2 96%   BMI 34.74 kg/m   Wt Readings from Last 3 Encounters:  12/25/17 221 lb 12.8 oz (100.6 kg)  12/21/17 214 lb (97.1 kg)  12/06/17 224 lb 1 oz (101.6 kg)    Physical Exam  Constitutional: He is oriented to person, place, and time. He appears well-developed and well-nourished. No distress.  HENT:  Head: Normocephalic and atraumatic.  Right Ear: Hearing, tympanic membrane, external ear and ear canal normal.  Left Ear: Hearing, tympanic  membrane, external ear and ear canal normal.  Nose: Nose normal.  Mouth/Throat: Uvula is midline, oropharynx is clear and moist and mucous membranes are normal. No oropharyngeal exudate.  Eyes: Pupils are equal, round, and reactive to light.  Conjunctivae, EOM and lids are normal. Right eye exhibits no discharge. Left eye exhibits no discharge. No scleral icterus.  Neck: Normal range of motion. Neck supple. No JVD present. No tracheal deviation present. No thyromegaly present.  Cardiovascular: Normal rate, regular rhythm, normal heart sounds and intact distal pulses. Exam reveals no gallop and no friction rub.  No murmur heard. Pulmonary/Chest: Effort normal and breath sounds normal. No stridor. No respiratory distress. He has no wheezes. He has no rales. He exhibits no tenderness.  Abdominal: Soft. Bowel sounds are normal. He exhibits no distension and no mass. There is no tenderness. There is no rebound and no guarding. No hernia.  Musculoskeletal: Normal range of motion. He exhibits no edema, tenderness or deformity.  Lymphadenopathy:    He has no cervical adenopathy.  Neurological: He is alert and oriented to person, place, and time. He displays normal reflexes. No cranial nerve deficit or sensory deficit. He exhibits normal muscle tone. Coordination normal.  Skin: Skin is warm, dry and intact. Capillary refill takes less than 2 seconds. Rash (pustular erythematous rash on face) noted. He is not diaphoretic. No erythema. No pallor.  Psychiatric: He has a normal mood and affect. His speech is normal and behavior is normal. Judgment and thought content normal. Cognition and memory are normal.  Nursing note and vitals reviewed.   Results for orders placed or performed in visit on 10/11/17  ANA w/Reflex if Positive  Result Value Ref Range   Anti Nuclear Antibody(ANA) Negative Negative  Rheumatoid factor  Result Value Ref Range   Rhuematoid fact SerPl-aCnc <10.0 0.0 - 13.9 IU/mL        Assessment & Plan:   Problem List Items Addressed This Visit      Musculoskeletal and Integument   Rosacea    Will treat with metrogel. Call with any concerns.         Other   Chronic upper back pain (Midline) (Chronic)    Doing better with his ESIs. Continue to follow with pain management. Call with any concerns.        Other Visit Diagnoses    Routine general medical examination at a health care facility    -  Primary   Vaccines up to date. Screening labs checked today. Continue diet and exercise. Call with any concerns.    Relevant Orders   CBC with Differential/Platelet   Comprehensive metabolic panel   Lipid Panel Piccolo, Waived   TSH   UA/M w/rflx Culture, Routine   Dysphagia, unspecified type       Worsening. Discussed pureeing food. Has appointment with GI in 1 week.        LABORATORY TESTING:  Health maintenance labs ordered today as discussed above.   IMMUNIZATIONS:   - Tdap: Tetanus vaccination status reviewed: last tetanus booster within 10 years. - Influenza: Up to date - Pneumovax: Not applicable  PATIENT COUNSELING:    Sexuality: Discussed sexually transmitted diseases, partner selection, use of condoms, avoidance of unintended pregnancy  and contraceptive alternatives.   Advised to avoid cigarette smoking.  I discussed with the patient that most people either abstain from alcohol or drink within safe limits (<=14/week and <=4 drinks/occasion for males, <=7/weeks and <= 3 drinks/occasion for females) and that the risk for alcohol disorders and other health effects rises proportionally with the number of drinks per week and how often a drinker exceeds daily limits.  Discussed cessation/primary prevention of drug use and availability of treatment for abuse.   Diet: Encouraged  to adjust caloric intake to maintain  or achieve ideal body weight, to reduce intake of dietary saturated fat and total fat, to limit sodium intake by avoiding high sodium foods  and not adding table salt, and to maintain adequate dietary potassium and calcium preferably from fresh fruits, vegetables, and low-fat dairy products.    stressed the importance of regular exercise  Injury prevention: Discussed safety belts, safety helmets, smoke detector, smoking near bedding or upholstery.   Dental health: Discussed importance of regular tooth brushing, flossing, and dental visits.   Follow up plan: NEXT PREVENTATIVE PHYSICAL DUE IN 1 YEAR. Return in about 6 months (around 06/25/2018).

## 2017-12-25 NOTE — Assessment & Plan Note (Signed)
Will treat with metrogel. Call with any concerns.

## 2017-12-26 LAB — CBC WITH DIFFERENTIAL/PLATELET
BASOS ABS: 0 10*3/uL (ref 0.0–0.2)
Basos: 0 %
EOS (ABSOLUTE): 0.2 10*3/uL (ref 0.0–0.4)
Eos: 3 %
Hematocrit: 43.7 % (ref 37.5–51.0)
Hemoglobin: 15 g/dL (ref 13.0–17.7)
Immature Grans (Abs): 0 10*3/uL (ref 0.0–0.1)
Immature Granulocytes: 1 %
LYMPHS ABS: 1.8 10*3/uL (ref 0.7–3.1)
Lymphs: 25 %
MCH: 29.7 pg (ref 26.6–33.0)
MCHC: 34.3 g/dL (ref 31.5–35.7)
MCV: 87 fL (ref 79–97)
MONOS ABS: 0.6 10*3/uL (ref 0.1–0.9)
Monocytes: 8 %
Neutrophils Absolute: 4.5 10*3/uL (ref 1.4–7.0)
Neutrophils: 63 %
PLATELETS: 326 10*3/uL (ref 150–450)
RBC: 5.05 x10E6/uL (ref 4.14–5.80)
RDW: 12.3 % (ref 12.3–15.4)
WBC: 7.2 10*3/uL (ref 3.4–10.8)

## 2017-12-26 LAB — COMPREHENSIVE METABOLIC PANEL
A/G RATIO: 2 (ref 1.2–2.2)
ALK PHOS: 98 IU/L (ref 39–117)
ALT: 19 IU/L (ref 0–44)
AST: 14 IU/L (ref 0–40)
Albumin: 4.4 g/dL (ref 3.5–5.5)
BUN/Creatinine Ratio: 11 (ref 9–20)
BUN: 12 mg/dL (ref 6–20)
Bilirubin Total: 0.3 mg/dL (ref 0.0–1.2)
CHLORIDE: 101 mmol/L (ref 96–106)
CO2: 25 mmol/L (ref 20–29)
Calcium: 9.1 mg/dL (ref 8.7–10.2)
Creatinine, Ser: 1.08 mg/dL (ref 0.76–1.27)
GFR calc Af Amer: 102 mL/min/{1.73_m2} (ref >59)
GFR calc non Af Amer: 88 mL/min/{1.73_m2} (ref >59)
GLUCOSE: 78 mg/dL (ref 65–99)
Globulin, Total: 2.2 g/dL (ref 1.5–4.5)
POTASSIUM: 4.1 mmol/L (ref 3.5–5.2)
Sodium: 141 mmol/L (ref 134–144)
Total Protein: 6.6 g/dL (ref 6.0–8.5)

## 2017-12-26 LAB — TSH: TSH: 0.665 u[IU]/mL (ref 0.450–4.500)

## 2018-01-04 ENCOUNTER — Ambulatory Visit (INDEPENDENT_AMBULATORY_CARE_PROVIDER_SITE_OTHER): Payer: 59 | Admitting: Gastroenterology

## 2018-01-04 ENCOUNTER — Other Ambulatory Visit: Payer: Self-pay

## 2018-01-04 ENCOUNTER — Encounter: Payer: Self-pay | Admitting: Gastroenterology

## 2018-01-04 VITALS — BP 147/96 | HR 89 | Ht 67.0 in | Wt 225.8 lb

## 2018-01-04 DIAGNOSIS — R1319 Other dysphagia: Secondary | ICD-10-CM

## 2018-01-04 DIAGNOSIS — R1310 Dysphagia, unspecified: Secondary | ICD-10-CM

## 2018-01-04 DIAGNOSIS — R4702 Dysphasia: Secondary | ICD-10-CM

## 2018-01-04 NOTE — H&P (View-Only) (Signed)
Gastroenterology Consultation  Referring Provider:     Dorcas Carrow, DO Primary Care Physician:  Dorcas Carrow, DO Primary Gastroenterologist:  Dr. Servando Snare     Reason for Consultation:     Dysphasia        HPI:   Adolpho Meenach is a 37 y.o. y/o male referred for consultation & management of dysphasia by Dr. Laural Benes, Megan P, DO.  This patient comes in today after seeing her primary care provider back on November 25.  At that time the patient was reporting that she had vomiting with every meal.  The patient weighs 221 pounds at that visit with her PCP with a weight of 216 at the beginning of September.  He was also 223 pounds and April of this year.  The patient had lab work sent off from his primary care provider that showed a normal CBC and CMP. He is 228 lbs today.  The patient reports that he feels like food is sticking and is not typically vomiting every day and with every meal.  The patient reports that coffee will make his symptoms worse.  He is denying any overt heartburn.  The patient denies any black stools or bloody stools.  He also denies any fevers or chills.  There is no family history of any colon cancer or colon polyps.  The patient does not report any abdominal pain associated with his present symptoms.  He is presently not taking anything for heartburn.  Past Medical History:  Diagnosis Date  . Anxiety   . Depression     Past Surgical History:  Procedure Laterality Date  . WISDOM TOOTH EXTRACTION  37yo   37yo  . WISDOM TOOTH EXTRACTION Bilateral 2002    Prior to Admission medications   Medication Sig Start Date End Date Taking? Authorizing Provider  Cholecalciferol (VITAMIN D3) 5000 units CAPS Take 1 capsule (5,000 Units total) by mouth daily with breakfast. Take along with calcium and magnesium. 09/04/17 03/03/18  Delano Metz, MD  gabapentin (NEURONTIN) 100 MG capsule Take 100 mg by mouth daily.    [provider]  ibuprofen (ADVIL,MOTRIN) 600 MG  tablet Take 1 tablet (600 mg total) by mouth every 6 (six) hours as needed. 08/10/17   Dionne Bucy, MD  loratadine (CLARITIN) 10 MG tablet Take 10 mg by mouth daily.    [provider]  Magnesium 500 MG CAPS Take 1 capsule (500 mg total) by mouth 2 (two) times daily at 8 am and 10 pm. 09/04/17 03/03/18  Delano Metz, MD  meloxicam (MOBIC) 7.5 MG tablet Take 7.5 mg by mouth daily.    [provider]  metroNIDAZOLE (METROGEL) 1 % gel Apply topically daily. 12/25/17   Olevia Perches P, DO  Multiple Vitamin (CALCIUM COMPLEX PO) Take 100 mg by mouth daily.    [provider]  tiZANidine (ZANAFLEX) 4 MG tablet Take 4 mg by mouth daily.    [provider]    Family History  Problem Relation Age of Onset  . Drug abuse Father   . Multiple sclerosis Father   . Cancer Maternal Grandmother        Breast and lung     Social History   Tobacco Use  . Smoking status: Former Smoker    Types: Cigarettes  . Smokeless tobacco: Former Neurosurgeon    Types: Chew  Substance Use Topics  . Alcohol use: Yes    Comment: occassional   . Drug use: Never    Allergies  as of 01/04/2018  . (No Known Allergies)    Review of Systems:    All systems reviewed and negative except where noted in HPI.   Physical Exam:  There were no vitals taken for this visit. No LMP for male patient. General:   Alert,  Well-developed, well-nourished, pleasant and cooperative in NAD Head:  Normocephalic and atraumatic. Eyes:  Sclera clear, no icterus.   Conjunctiva pink. Ears:  Normal auditory acuity. Nose:  No deformity, discharge, or lesions. Mouth:  No deformity or lesions,oropharynx pink & moist. Neck:  Supple; no masses or thyromegaly. Lungs:  Respirations even and unlabored.  Clear throughout to auscultation.   No wheezes, crackles, or rhonchi. No acute distress. Heart:  Regular rate and rhythm; no murmurs, clicks, rubs, or gallops. Abdomen:  Normal bowel sounds.  No bruits.   Soft, non-tender and non-distended without masses, hepatosplenomegaly or hernias noted.  No guarding or rebound tenderness.  Negative Carnett sign.   Rectal:  Deferred.  Msk:  Symmetrical without gross deformities.  Good, equal movement & strength bilaterally. Pulses:  Normal pulses noted. Extremities:  No clubbing or edema.  No cyanosis. Neurologic:  Alert and oriented x3;  grossly normal neurologically. Skin:  Intact without significant lesions or rashes.  No jaundice. Lymph Nodes:  No significant cervical adenopathy. Psych:  Alert and cooperative. Normal mood and affect.  Imaging Studies: Dg C-arm 1-60 Min-no Report  Result Date: 12/21/2017 Fluoroscopy was utilized by the requesting physician.  No radiographic interpretation.    Assessment and Plan:   Jeanell SparrowKeith Cravey is a 37 y.o. y/o male who comes in with intermittent dysphasia without any unexplained weight loss.  He denies food actually getting stuck requiring him to go to the ER and reports that he is usually able to either get the food down or have it,.  It is worse with solids than it is with small foods or liquids.  The patient will be set up for a EGD to investigate the esophagus for any sign of esophageal pathology such as strictures or webs as the cause of the patient's symptoms. I have discussed risks & benefits which include, but are not limited to, bleeding, infection, perforation & drug reaction.  The patient agrees with this plan & written consent will be obtained.     Midge Miniumarren Quentina Fronek, MD. Clementeen GrahamFACG    Note: This dictation was prepared with Dragon dictation along with smaller phrase technology. Any transcriptional errors that result from this process are unintentional.

## 2018-01-04 NOTE — Progress Notes (Signed)
  Gastroenterology Consultation  Referring Provider:     Johnson, Megan P, DO Primary Care Physician:  Johnson, Megan P, DO Primary Gastroenterologist:  Dr. Eliette Drumwright     Reason for Consultation:     Dysphasia        HPI:   Shawn Quinn is a 37 y.o. y/o male referred for consultation & management of dysphasia by Dr. Johnson, Megan P, DO.  This patient comes in today after seeing her primary care provider back on November 25.  At that time the patient was reporting that she had vomiting with every meal.  The patient weighs 221 pounds at that visit with her PCP with a weight of 216 at the beginning of September.  He was also 223 pounds and April of this year.  The patient had lab work sent off from his primary care provider that showed a normal CBC and CMP. He is 228 lbs today.  The patient reports that he feels like food is sticking and is not typically vomiting every day and with every meal.  The patient reports that coffee will make his symptoms worse.  He is denying any overt heartburn.  The patient denies any black stools or bloody stools.  He also denies any fevers or chills.  There is no family history of any colon cancer or colon polyps.  The patient does not report any abdominal pain associated with his present symptoms.  He is presently not taking anything for heartburn.  Past Medical History:  Diagnosis Date  . Anxiety   . Depression     Past Surgical History:  Procedure Laterality Date  . WISDOM TOOTH EXTRACTION  37yo   37yo  . WISDOM TOOTH EXTRACTION Bilateral 2002    Prior to Admission medications   Medication Sig Start Date End Date Taking? Authorizing Provider  Cholecalciferol (VITAMIN D3) 5000 units CAPS Take 1 capsule (5,000 Units total) by mouth daily with breakfast. Take along with calcium and magnesium. 09/04/17 03/03/18  Naveira, Francisco, MD  gabapentin (NEURONTIN) 100 MG capsule Take 100 mg by mouth daily.    [provider]  ibuprofen (ADVIL,MOTRIN) 600 MG  tablet Take 1 tablet (600 mg total) by mouth every 6 (six) hours as needed. 08/10/17   Siadecki, Sebastian, MD  loratadine (CLARITIN) 10 MG tablet Take 10 mg by mouth daily.    [provider]  Magnesium 500 MG CAPS Take 1 capsule (500 mg total) by mouth 2 (two) times daily at 8 am and 10 pm. 09/04/17 03/03/18  Naveira, Francisco, MD  meloxicam (MOBIC) 7.5 MG tablet Take 7.5 mg by mouth daily.    [provider]  metroNIDAZOLE (METROGEL) 1 % gel Apply topically daily. 12/25/17   Johnson, Megan P, DO  Multiple Vitamin (CALCIUM COMPLEX PO) Take 100 mg by mouth daily.    [provider]  tiZANidine (ZANAFLEX) 4 MG tablet Take 4 mg by mouth daily.    [provider]    Family History  Problem Relation Age of Onset  . Drug abuse Father   . Multiple sclerosis Father   . Cancer Maternal Grandmother        Breast and lung     Social History   Tobacco Use  . Smoking status: Former Smoker    Types: Cigarettes  . Smokeless tobacco: Former User    Types: Chew  Substance Use Topics  . Alcohol use: Yes    Comment: occassional   . Drug use: Never    Allergies   as of 01/04/2018  . (No Known Allergies)    Review of Systems:    All systems reviewed and negative except where noted in HPI.   Physical Exam:  There were no vitals taken for this visit. No LMP for male patient. General:   Alert,  Well-developed, well-nourished, pleasant and cooperative in NAD Head:  Normocephalic and atraumatic. Eyes:  Sclera clear, no icterus.   Conjunctiva pink. Ears:  Normal auditory acuity. Nose:  No deformity, discharge, or lesions. Mouth:  No deformity or lesions,oropharynx pink & moist. Neck:  Supple; no masses or thyromegaly. Lungs:  Respirations even and unlabored.  Clear throughout to auscultation.   No wheezes, crackles, or rhonchi. No acute distress. Heart:  Regular rate and rhythm; no murmurs, clicks, rubs, or gallops. Abdomen:  Normal bowel sounds.  No bruits.   Soft, non-tender and non-distended without masses, hepatosplenomegaly or hernias noted.  No guarding or rebound tenderness.  Negative Carnett sign.   Rectal:  Deferred.  Msk:  Symmetrical without gross deformities.  Good, equal movement & strength bilaterally. Pulses:  Normal pulses noted. Extremities:  No clubbing or edema.  No cyanosis. Neurologic:  Alert and oriented x3;  grossly normal neurologically. Skin:  Intact without significant lesions or rashes.  No jaundice. Lymph Nodes:  No significant cervical adenopathy. Psych:  Alert and cooperative. Normal mood and affect.  Imaging Studies: Dg C-arm 1-60 Min-no Report  Result Date: 12/21/2017 Fluoroscopy was utilized by the requesting physician.  No radiographic interpretation.    Assessment and Plan:   [name] is a 37 y.o. y/o male who comes in with intermittent dysphasia without any unexplained weight loss.  He denies food actually getting stuck requiring him to go to the ER and reports that he is usually able to either get the food down or have it,.  It is worse with solids than it is with small foods or liquids.  The patient will be set up for a EGD to investigate the esophagus for any sign of esophageal pathology such as strictures or webs as the cause of the patient's symptoms. I have discussed risks & benefits which include, but are not limited to, bleeding, infection, perforation & drug reaction.  The patient agrees with this plan & written consent will be obtained.     Jonnathan Birman, MD. FACG    Note: This dictation was prepared with Dragon dictation along with smaller phrase technology. Any transcriptional errors that result from this process are unintentional.   

## 2018-01-07 NOTE — Progress Notes (Signed)
Patient's Name: Shawn Quinn  MRN: [id number]  Referring Provider: Valerie Roys, DO  DOB: 1981/01/12  PCP: Valerie Roys, DO  DOS: 01/10/2018  Note by: Gaspar Cola, MD  Service setting: Ambulatory outpatient  Specialty: Interventional Pain Management  Location: [facility] (AMB) Pain Management Facility    Patient type: Established   Primary Reason(s) for Visit: Encounter for post-procedure evaluation of chronic illness with mild to moderate exacerbation CC: Back Pain  HPI  Shawn Quinn is a 37 y.o. year old, male patient, who comes today for a post-procedure evaluation. He has Environmental allergies; Chronic thoracic back pain (Midline) (Primary Area of Pain); Chronic pain syndrome; Chronic upper back pain (Midline) (Primary Area of Pain); Pharmacologic therapy; Disorder of skeletal system; Problems influencing health status; Opiate use; Controlled substance agreement signed; DDD (degenerative disc disease), cervical; Rhomboid muscle pain; Vitamin D deficiency; Cervical radiculitis (C5) (Left); Cervicalgia; Cervical spondylosis without myelopathy (C5-6); Elevated sed rate; Elevated C-reactive protein (CRP); Rosacea; and Numbness and tingling in arm (Left) on their problem list. His primarily concern today is the Back Pain  Pain Assessment: Location: Upper Back Radiating: Denies Onset: More than a month ago Duration: Chronic pain Severity: 0-No pain/10 (subjective, self-reported pain score)  Note: Reported level is compatible with observation.                               Timing: Intermittent Modifying factors: heat BP: (!) 129/96  HR: 70  Shawn Quinn comes in today for post-procedure evaluation.  Further details on both, my assessment(s), as well as the proposed treatment plan, please see below.  Post-Procedure Assessment  12/21/2017 Procedure: DiagnosticLeftCervicalESI #2under fluoroscopic guidance, no sedation Pre-procedure pain score:  1/10 Post-procedure pain score: 1/10  Limited initial benefit, possibly due to rapid discharge after no sedation procedure, without enough time to allow full onset of block. Influential Factors: BMI: 34.14 kg/m Intra-procedural challenges: None observed.         Assessment challenges: None detected.              Reported side-effects: None.        Post-procedural adverse reactions or complications: None reported         Sedation: No sedation used. When no sedatives are used, the analgesic levels obtained are directly associated to the effectiveness of the local anesthetics. However, when sedation is provided, the level of analgesia obtained during the initial 1 hour following the intervention, is believed to be the result of a combination of factors. These factors may include, but are not limited to: 1. The effectiveness of the local anesthetics used. 2. The effects of the analgesic(s) and/or anxiolytic(s) used. 3. The degree of discomfort experienced by the patient at the time of the procedure. 4. The patients ability and reliability in recalling and recording the events. 5. The presence and influence of possible secondary gains and/or psychosocial factors. Reported result: Relief experienced during the 1st hour after the procedure: 100 % (Ultra-Short Term Relief)            Interpretative annotation: Clinically appropriate result. No IV Analgesic or Anxiolytic given, therefore benefits are completely due to Local Anesthetic effects.          Effects of local anesthetic: The analgesic effects attained during this period are directly associated to the localized infiltration of local anesthetics and therefore cary significant diagnostic value as to the etiological location, or anatomical origin, of the pain. Expected duration  of relief is directly dependent on the pharmacodynamics of the local anesthetic used. Long-acting (4-6 hours) anesthetics used.  Reported result: Relief during the next 4 to 6 hour after the procedure: 100 %  (Short-Term Relief)            Interpretative annotation: Clinically appropriate result. Analgesia during this period is likely to be Local Anesthetic-related.          Long-term benefit: Defined as the period of time past the expected duration of local anesthetics (1 hour for short-acting and 4-6 hours for long-acting). With the possible exception of prolonged sympathetic blockade from the local anesthetics, benefits during this period are typically attributed to, or associated with, other factors such as analgesic sensory neuropraxia, antiinflammatory effects, or beneficial biochemical changes provided by agents other than the local anesthetics.  Reported result: Extended relief following procedure: 100 % (Long-Term Relief)            Interpretative annotation: Clinically possible results. Good relief. Therapeutic success. Inflammation plays a part in the etiology to the pain.          Current benefits: Defined as reported results that persistent at this point in time.   Analgesia: 100 % [name] reports improvement of axial and extremity symptoms. Function: Shawn Quinn reports improvement in function ROM: Shawn Quinn reports improvement in ROM Interpretative annotation: Complete relief. Therapeutic success. Effective therapeutic approach.          Interpretation: Results would suggest a successful diagnostic intervention.                  Plan:  Set up procedure as a PRN palliative treatment option for this patient.                Laboratory Chemistry  Inflammation Markers (CRP: Acute Phase) (ESR: Chronic Phase) Lab Results  Component Value Date   CRP 11 (H) 08/17/2017   ESRSEDRATE 29 (H) 08/17/2017                         Rheumatology Markers Lab Results  Component Value Date   RF <10.0 10/11/2017   ANA Negative 10/11/2017                        Renal Markers Lab Results  Component Value Date   BUN 12 12/25/2017   CREATININE 1.08 12/25/2017   BCR 11 12/25/2017   GFRAA 102  12/25/2017   GFRNONAA 88 12/25/2017                             Hepatic Markers Lab Results  Component Value Date   AST 14 12/25/2017   ALT 19 12/25/2017   ALBUMIN 4.4 12/25/2017                        Neuropathy Markers Lab Results  Component Value Date   VITAMINB12 281 08/17/2017                        Hematology Parameters Lab Results  Component Value Date   PLT 326 12/25/2017   HGB 15.0 12/25/2017   HCT 43.7 12/25/2017                        CV Markers No results found.  Note: Lab results reviewed.  Recent Imaging  Results   Results for orders placed in visit on 12/21/17  DG C-Arm 1-60 Min-No Report   Narrative Fluoroscopy was utilized by the requesting physician.  No radiographic  interpretation.    Interpretation Report: Fluoroscopy was used during the procedure to assist with needle guidance. The images were interpreted intraoperatively by the requesting physician.  Meds   Current Outpatient Medications:  .  Cholecalciferol (VITAMIN D3) 5000 units CAPS, Take 1 capsule (5,000 Units total) by mouth daily with breakfast. Take along with calcium and magnesium., Disp: 30 capsule, Rfl: 5 .  Dexlansoprazole (DEXILANT) 30 MG capsule, Take 30 mg by mouth daily., Disp: , Rfl:  .  fexofenadine-pseudoephedrine (ALLEGRA-D) 60-120 MG 12 hr tablet, Take 1 tablet by mouth 2 (two) times daily., Disp: , Rfl:  .  gabapentin (NEURONTIN) 100 MG capsule, Take 100 mg by mouth daily., Disp: , Rfl:  .  ibuprofen (ADVIL,MOTRIN) 600 MG tablet, Take 1 tablet (600 mg total) by mouth every 6 (six) hours as needed., Disp: 30 tablet, Rfl: 0 .  Magnesium 500 MG CAPS, Take 1 capsule (500 mg total) by mouth 2 (two) times daily at 8 am and 10 pm., Disp: 60 capsule, Rfl: 5 .  meloxicam (MOBIC) 7.5 MG tablet, Take 7.5 mg by mouth daily., Disp: , Rfl:  .  metroNIDAZOLE (METROGEL) 1 % gel, Apply topically daily., Disp: 45 g, Rfl: 6 .  Multiple Vitamin (CALCIUM COMPLEX PO), Take 100 mg by mouth  daily., Disp: , Rfl:  .  tiZANidine (ZANAFLEX) 4 MG tablet, Take 4 mg by mouth daily., Disp: , Rfl:   ROS  Constitutional: Denies any fever or chills Gastrointestinal: No reported hemesis, hematochezia, vomiting, or acute GI distress Musculoskeletal: Denies any acute onset joint swelling, redness, loss of ROM, or weakness Neurological: No reported episodes of acute onset apraxia, aphasia, dysarthria, agnosia, amnesia, paralysis, loss of coordination, or loss of consciousness  Allergies  Mr. Bhardwaj has No Known Allergies.  PFSH  Drug: Mr. Gardella  reports that he does not use drugs. Alcohol:  reports that he drinks alcohol. Tobacco:  reports that he quit smoking about 3 years ago. His smoking use included cigarettes. He has quit using smokeless tobacco.  His smokeless tobacco use included chew. Medical:  has a past medical history of Anxiety, DDD (degenerative disc disease), cervical, Depression, OSA on CPAP, and S/P epidural steroid injection. Surgical: Mr. Biegel  has a past surgical history that includes Wisdom tooth extraction (37yo) and Wisdom tooth extraction (Bilateral, 2002). Family: family history includes Cancer in his maternal grandmother; Drug abuse in his father; Multiple sclerosis in his father.  Constitutional Exam  General appearance: Well nourished, well developed, and well hydrated. In no apparent acute distress Vitals:   01/10/18 1119  BP: (!) 129/96  Pulse: 70  Temp: 97.6 F (36.4 C)  SpO2: 100%  Weight: 218 lb (98.9 kg)  Height: 5' 7"  (1.702 m)   BMI Assessment: Estimated body mass index is 34.14 kg/m as calculated from the following:   Height as of this encounter: 5' 7"  (1.702 m).   Weight as of this encounter: 218 lb (98.9 kg).  BMI interpretation table: BMI level Category Range association with higher incidence of chronic pain  <18 kg/m2 Underweight   18.5-24.9 kg/m2 Ideal body weight   25-29.9 kg/m2 Overweight Increased incidence by 20%  30-34.9 kg/m2  Obese (Class I) Increased incidence by 68%  35-39.9 kg/m2 Severe obesity (Class II) Increased incidence by 136%  >40 kg/m2 Extreme obesity (Class III)  Increased incidence by 254%   Patient's current BMI Ideal Body weight  Body mass index is 34.14 kg/m. Ideal body weight: 66.1 kg (145 lb 11.6 oz) Adjusted ideal body weight: 79.2 kg (174 lb 10.1 oz)   BMI Readings from Last 4 Encounters:  01/10/18 34.14 kg/m  01/04/18 35.37 kg/m  12/25/17 34.74 kg/m  12/21/17 33.52 kg/m   Wt Readings from Last 4 Encounters:  01/10/18 218 lb (98.9 kg)  01/04/18 225 lb 12.8 oz (102.4 kg)  12/25/17 221 lb 12.8 oz (100.6 kg)  12/21/17 214 lb (97.1 kg)  Psych/Mental status: Alert, oriented x 3 (person, place, & time)       Eyes: PERLA Respiratory: No evidence of acute respiratory distress  Cervical Spine Area Exam  Skin & Axial Inspection: No masses, redness, edema, swelling, or associated skin lesions Alignment: Symmetrical Functional ROM: Improved after treatment      Stability: No instability detected Muscle Tone/Strength: Functionally intact. No obvious neuro-muscular anomalies detected. Sensory (Neurological): Unimpaired Palpation: No palpable anomalies              Upper Extremity (UE) Exam    Side: Right upper extremity  Side: Left upper extremity  Skin & Extremity Inspection: Skin color, temperature, and hair growth are WNL. No peripheral edema or cyanosis. No masses, redness, swelling, asymmetry, or associated skin lesions. No contractures.  Skin & Extremity Inspection: Skin color, temperature, and hair growth are WNL. No peripheral edema or cyanosis. No masses, redness, swelling, asymmetry, or associated skin lesions. No contractures.  Functional ROM: Unrestricted ROM          Functional ROM: Unrestricted ROM          Muscle Tone/Strength: Functionally intact. No obvious neuro-muscular anomalies detected.  Muscle Tone/Strength: Functionally intact. No obvious neuro-muscular anomalies  detected.  Sensory (Neurological): Unimpaired          Sensory (Neurological): Unimpaired          Palpation: No palpable anomalies              Palpation: No palpable anomalies              Provocative Test(s):  Phalen's test: deferred Tinel's test: deferred Apley's scratch test (touch opposite shoulder):  Action 1 (Across chest): deferred Action 2 (Overhead): deferred Action 3 (LB reach): deferred   Provocative Test(s):  Phalen's test: deferred Tinel's test: deferred Apley's scratch test (touch opposite shoulder):  Action 1 (Across chest): deferred Action 2 (Overhead): deferred Action 3 (LB reach): deferred    Thoracic Spine Area Exam  Skin & Axial Inspection: No masses, redness, or swelling Alignment: Symmetrical Functional ROM: Unrestricted ROM Stability: No instability detected Muscle Tone/Strength: Functionally intact. No obvious neuro-muscular anomalies detected. Sensory (Neurological): Unimpaired Muscle strength & Tone: No palpable anomalies  Lumbar Spine Area Exam  Skin & Axial Inspection: No masses, redness, or swelling Alignment: Symmetrical Functional ROM: Unrestricted ROM       Stability: No instability detected Muscle Tone/Strength: Functionally intact. No obvious neuro-muscular anomalies detected. Sensory (Neurological): Unimpaired Palpation: No palpable anomalies       Provocative Tests: Hyperextension/rotation test: deferred today       Lumbar quadrant test (Kemp's test): deferred today       Lateral bending test: deferred today       Patrick's Maneuver: deferred today                   FABER* test: deferred today  S-I anterior distraction/compression test: deferred today         S-I lateral compression test: deferred today         S-I Thigh-thrust test: deferred today         S-I Gaenslen's test: deferred today         *(Flexion, ABduction and External Rotation)  Gait & Posture Assessment  Ambulation: Unassisted Gait: Relatively  normal for age and body habitus Posture: WNL   Lower Extremity Exam    Side: Right lower extremity  Side: Left lower extremity  Stability: No instability observed          Stability: No instability observed          Skin & Extremity Inspection: Skin color, temperature, and hair growth are WNL. No peripheral edema or cyanosis. No masses, redness, swelling, asymmetry, or associated skin lesions. No contractures.  Skin & Extremity Inspection: Skin color, temperature, and hair growth are WNL. No peripheral edema or cyanosis. No masses, redness, swelling, asymmetry, or associated skin lesions. No contractures.  Functional ROM: Unrestricted ROM                  Functional ROM: Unrestricted ROM                  Muscle Tone/Strength: Functionally intact. No obvious neuro-muscular anomalies detected.  Muscle Tone/Strength: Functionally intact. No obvious neuro-muscular anomalies detected.  Sensory (Neurological): Unimpaired        Sensory (Neurological): Unimpaired        DTR: Patellar: deferred today Achilles: deferred today Plantar: deferred today  DTR: Patellar: deferred today Achilles: deferred today Plantar: deferred today  Palpation: No palpable anomalies  Palpation: No palpable anomalies   Assessment  Primary Diagnosis & Pertinent Problem List: The primary encounter diagnosis was Chronic upper back pain (Midline) (Primary Area of Pain). Diagnoses of Cervical radiculitis (C5) (Left), DDD (degenerative disc disease), cervical, Cervical spondylosis without myelopathy (C5-6), Rhomboid muscle pain, and Cervicalgia were also pertinent to this visit.  Status Diagnosis  Resolved Resolved Stable 1. Chronic upper back pain (Midline) (Primary Area of Pain)   2. Cervical radiculitis (C5) (Left)   3. DDD (degenerative disc disease), cervical   4. Cervical spondylosis without myelopathy (C5-6)   5. Rhomboid muscle pain   6. Cervicalgia     Problems updated and reviewed during this visit: Problem   Numbness and tingling in arm (Left)  Chronic upper back pain (Midline) (Primary Area of Pain)   Pain is described to be between the shoulder blades, in the area of the rhomboid muscles.   Chronic thoracic back pain (Midline) (Primary Area of Pain)  Rosacea   Plan of Care  Pharmacotherapy (Medications Ordered): No orders of the defined types were placed in this encounter.  Medications administered today: Jamarcus Laduke had no medications administered during this visit.   Procedure Orders     Cervical Epidural Injection Lab Orders  No laboratory test(s) ordered today   Imaging Orders  No imaging studies ordered today   Referral Orders  No referral(s) requested today   Interventional management options: Planned, scheduled, and/or pending:   None at this time At this time, the patient has no pain.  However, should the pain continue to recur will order an MRI of the cervical spine.   Considering:   Diagnostic leftCervicalESI #3 Diagnostic trigger point injections Diagnostic midline thoracic epidural steroid injection Diagnostic midline thoracic facet nerve block   Palliative PRN treatment(s):   PalliativeleftCervicalESI #3under fluoroscopic guidance,  no sedation   Provider-requested follow-up: Return for PRN Procedure: (L) CESI #3 (No Sedation).  No future appointments. Primary Care Physician: Valerie Roys, DO Location: [facility] Note by: Gaspar Cola, MD Date: 01/10/2018; Time: 1:38 PM

## 2018-01-08 NOTE — Anesthesia Preprocedure Evaluation (Addendum)
Anesthesia Evaluation  Patient identified by MRN, date of birth, ID band Patient awake    Reviewed: Allergy & Precautions, NPO status , Patient's Chart, lab work & pertinent test results  History of Anesthesia Complications (+) DIFFICULT IV STICK / SPECIAL LINE and history of anesthetic complications  Airway Mallampati: III   Neck ROM: Full    Dental no notable dental hx.    Pulmonary sleep apnea and Continuous Positive Airway Pressure Ventilation , former smoker (quit 2016),    Pulmonary exam normal breath sounds clear to auscultation       Cardiovascular Exercise Tolerance: Good negative cardio ROS Normal cardiovascular exam Rhythm:Regular Rate:Normal     Neuro/Psych PSYCHIATRIC DISORDERS Anxiety Depression DDD    GI/Hepatic negative GI ROS,   Endo/Other  negative endocrine ROS  Renal/GU negative Renal ROS     Musculoskeletal   Abdominal   Peds  Hematology negative hematology ROS (+)   Anesthesia Other Findings   Reproductive/Obstetrics                            Anesthesia Physical Anesthesia Plan  ASA: II  Anesthesia Plan: General   Post-op Pain Management:    Induction: Intravenous  PONV Risk Score and Plan: 2 and Propofol infusion and TIVA  Airway Management Planned: Natural Airway  Additional Equipment:   Intra-op Plan:   Post-operative Plan:   Informed Consent: I have reviewed the patients History and Physical, chart, labs and discussed the procedure including the risks, benefits and alternatives for the proposed anesthesia with the patient or authorized representative who has indicated his/her understanding and acceptance.     Plan Discussed with: CRNA  Anesthesia Plan Comments:        Anesthesia Quick Evaluation

## 2018-01-10 ENCOUNTER — Ambulatory Visit: Payer: 59 | Attending: Pain Medicine | Admitting: Pain Medicine

## 2018-01-10 ENCOUNTER — Other Ambulatory Visit: Payer: Self-pay

## 2018-01-10 ENCOUNTER — Encounter: Payer: Self-pay | Admitting: Pain Medicine

## 2018-01-10 VITALS — BP 129/96 | HR 70 | Temp 97.6°F | Ht 67.0 in | Wt 218.0 lb

## 2018-01-10 DIAGNOSIS — M47812 Spondylosis without myelopathy or radiculopathy, cervical region: Secondary | ICD-10-CM | POA: Diagnosis not present

## 2018-01-10 DIAGNOSIS — M542 Cervicalgia: Secondary | ICD-10-CM

## 2018-01-10 DIAGNOSIS — M4722 Other spondylosis with radiculopathy, cervical region: Secondary | ICD-10-CM | POA: Diagnosis not present

## 2018-01-10 DIAGNOSIS — M5010 Cervical disc disorder with radiculopathy, unspecified cervical region: Secondary | ICD-10-CM | POA: Insufficient documentation

## 2018-01-10 DIAGNOSIS — G894 Chronic pain syndrome: Secondary | ICD-10-CM | POA: Diagnosis not present

## 2018-01-10 DIAGNOSIS — Z791 Long term (current) use of non-steroidal anti-inflammatories (NSAID): Secondary | ICD-10-CM | POA: Diagnosis not present

## 2018-01-10 DIAGNOSIS — M5030 Other cervical disc degeneration, unspecified cervical region: Secondary | ICD-10-CM | POA: Diagnosis not present

## 2018-01-10 DIAGNOSIS — M791 Myalgia, unspecified site: Secondary | ICD-10-CM | POA: Insufficient documentation

## 2018-01-10 DIAGNOSIS — M546 Pain in thoracic spine: Secondary | ICD-10-CM | POA: Diagnosis not present

## 2018-01-10 DIAGNOSIS — M549 Dorsalgia, unspecified: Secondary | ICD-10-CM

## 2018-01-10 DIAGNOSIS — E559 Vitamin D deficiency, unspecified: Secondary | ICD-10-CM | POA: Diagnosis not present

## 2018-01-10 DIAGNOSIS — Z79899 Other long term (current) drug therapy: Secondary | ICD-10-CM | POA: Insufficient documentation

## 2018-01-10 DIAGNOSIS — M5412 Radiculopathy, cervical region: Secondary | ICD-10-CM

## 2018-01-10 DIAGNOSIS — R200 Anesthesia of skin: Secondary | ICD-10-CM | POA: Insufficient documentation

## 2018-01-10 DIAGNOSIS — Z87891 Personal history of nicotine dependence: Secondary | ICD-10-CM | POA: Diagnosis not present

## 2018-01-10 DIAGNOSIS — M7918 Myalgia, other site: Secondary | ICD-10-CM

## 2018-01-10 DIAGNOSIS — G8929 Other chronic pain: Secondary | ICD-10-CM

## 2018-01-10 DIAGNOSIS — R202 Paresthesia of skin: Secondary | ICD-10-CM

## 2018-01-10 DIAGNOSIS — Z792 Long term (current) use of antibiotics: Secondary | ICD-10-CM | POA: Insufficient documentation

## 2018-01-10 NOTE — Patient Instructions (Addendum)
____________________________________________________________________________________________  Pain Prevention Technique  Definition:   A technique used to minimize the effects of an activity known to cause inflammation or swelling, which in turn leads to an increase in pain.  Purpose: To prevent swelling from occurring. It is based on the fact that it is easier to prevent swelling from happening than it is to get rid of it, once it occurs.  Contraindications: 1. Anyone with allergy or hypersensitivity to the recommended medications. 2. Anyone taking anticoagulants (Blood Thinners) (e.g., Coumadin, Warfarin, Plavix, etc.). 3. Patients in Renal Failure.  Technique: Before you undertake an activity known to cause pain, or a flare-up of your chronic pain, and before you experience any pain, do the following:  1. On a full stomach, take 4 (four) over the counter Ibuprofens 200mg  tablets (Motrin), for a total of 800 mg. 2. In addition, take over the counter Magnesium 400 to 500 mg, before doing the activity.  3. Six (6) hours later, again on a full stomach, repeat the Ibuprofen. 4. That night, take a warm shower and stretch under the running warm water.  This technique may be sufficient to abort the pain and discomfort before it happens. Keep in mind that it takes a lot less medication to prevent swelling than it takes to eliminate it once it occurs.  ____________________________________________________________________________________________   ____________________________________________________________________________________________  Preparing for your procedure (without sedation)  Instructions: . Oral Intake: Do not eat or drink anything for at least 3 hours prior to your procedure. . Transportation: Unless otherwise stated by your physician, you may drive yourself after the procedure. . Blood Pressure Medicine: Take your blood pressure medicine with a sip of water the morning of the  procedure. . Blood thinners: Notify our staff if you are taking any blood thinners. Depending on which one you take, there will be specific instructions on how and when to stop it. . Diabetics on insulin: Notify the staff so that you can be scheduled 1st case in the morning. If your diabetes requires high dose insulin, take only  of your normal insulin dose the morning of the procedure and notify the staff that you have done so. . Preventing infections: Shower with an antibacterial soap the morning of your procedure.  . Build-up your immune system: Take 1000 mg of Vitamin C with every meal (3 times a day) the day prior to your procedure. Marland Kitchen Antibiotics: Inform the staff if you have a condition or reason that requires you to take antibiotics before dental procedures. . Pregnancy: If you are pregnant, call and cancel the procedure. . Sickness: If you have a cold, fever, or any active infections, call and cancel the procedure. . Arrival: You must be in the facility at least 30 minutes prior to your scheduled procedure. . Children: Do not bring any children with you. . Dress appropriately: Bring dark clothing that you would not mind if they get stained. . Valuables: Do not bring any jewelry or valuables.  Procedure appointments are reserved for interventional treatments only. Marland Kitchen No Prescription Refills. . No medication changes will be discussed during procedure appointments. . No disability issues will be discussed.  Reasons to call and reschedule or cancel your procedure: (Following these recommendations will minimize the risk of a serious complication.) . Surgeries: Avoid having procedures within 2 weeks of any surgery. (Avoid for 2 weeks before or after any surgery). . Flu Shots: Avoid having procedures within 2 weeks of a flu shots or . (Avoid for 2 weeks before or after  immunizations). . Barium: Avoid having a procedure within 7-10 days after having had a radiological study involving the use of  radiological contrast. (Myelograms, Barium swallow or enema study). . Heart attacks: Avoid any elective procedures or surgeries for the initial 6 months after a "Myocardial Infarction" (Heart Attack). . Blood thinners: It is imperative that you stop these medications before procedures. Let us know if you if you take any blood thinner.  . Infection: Avoid procedures during or within two weeks of an infection (including chest colds or gastrointestinal problems). Symptoms associated with infections include: Localized redness, fever, chills, night sweats or profuse sweating, burning sensation when voiding, cough, congestion, stuffiness, runny nose, sore throat, diarrhea, nausea, vomiting, cold or Flu symptoms, recent or current infections. It is specially important if the infection is over the area that we intend to treat. Marland Kitchen. Heart and lung problems: Symptoms that may suggest an active cardiopulmonary problem include: cough, chest pain, breathing difficulties or shortness of breath, dizziness, ankle swelling, uncontrolled high or unusually low blood pressure, and/or palpitations. If you are experiencing any of these symptoms, cancel your procedure and contact your primary care physician for an evaluation.  Remember:  Regular Business hours are:  Monday to Thursday 8:00 AM to 4:00 PM  Provider's Schedule: Delano MetzFrancisco Clelia Trabucco, MD:  Procedure days: Tuesday and Thursday 7:30 AM to 4:00 PM  Edward JollyBilal Lateef, MD:  Procedure days: Monday and Wednesday 7:30 AM to 4:00 PM ____________________________________________________________________________________________     Epidural Steroid Injection An epidural steroid injection is a shot of steroid medicine and numbing medicine that is given into the space between the spinal cord and the bones in your back (epidural space). The shot helps relieve pain caused by an irritated or swollen nerve root. The amount of pain relief you get from the injection depends on what is  causing the nerve to be swollen and irritated, and how long your pain lasts. You are more likely to benefit from this injection if your pain is strong and comes on suddenly rather than if you have had pain for a long time. Tell a health care provider about:  Any allergies you have.  All medicines you are taking, including vitamins, herbs, eye drops, creams, and over-the-counter medicines.  Any problems you or family members have had with anesthetic medicines.  Any blood disorders you have.  Any surgeries you have had.  Any medical conditions you have.  Whether you are pregnant or may be pregnant. What are the risks? Generally, this is a safe procedure. However, problems may occur, including:  Headache.  Bleeding.  Infection.  Allergic reaction to medicines.  Damage to your nerves.  What happens before the procedure? Staying hydrated Follow instructions from your health care provider about hydration, which may include:  Up to 2 hours before the procedure - you may continue to drink clear liquids, such as water, clear fruit juice, black coffee, and plain tea.  Eating and drinking restrictions Follow instructions from your health care provider about eating and drinking, which may include:  8 hours before the procedure - stop eating heavy meals or foods such as meat, fried foods, or fatty foods.  6 hours before the procedure - stop eating light meals or foods, such as toast or cereal.  6 hours before the procedure - stop drinking milk or drinks that contain milk.  2 hours before the procedure - stop drinking clear liquids.  Medicine  You may be given medicines to lower anxiety.  Ask your health care  provider about: ? Changing or stopping your regular medicines. This is especially important if you are taking diabetes medicines or blood thinners. ? Taking medicines such as aspirin and ibuprofen. These medicines can thin your blood. Do not take these medicines before your  procedure if your health care provider instructs you not to. General instructions  Plan to have someone take you home from the hospital or clinic. What happens during the procedure?  You may receive a medicine to help you relax (sedative).  You will be asked to lie on your abdomen.  The injection site will be cleaned.  A numbing medicine (local anesthetic) will be used to numb the injection site.  A needle will be inserted through your skin into the epidural space. You may feel some discomfort when this happens. An X-ray machine will be used to make sure the needle is put as close as possible to the affected nerve.  A steroid medicine and a local anesthetic will be injected into the epidural space.  The needle will be removed.  A bandage (dressing) will be put over the injection site. What happens after the procedure?  Your blood pressure, heart rate, breathing rate, and blood oxygen level will be monitored until the medicines you were given have worn off.  Your arm or leg may feel weak or numb for a few hours.  The injection site may feel sore.  Do not drive for 24 hours if you received a sedative. This information is not intended to replace advice given to you by your health care provider. Make sure you discuss any questions you have with your health care provider. Document Released: 04/26/2007 Document Revised: 07/01/2015 Document Reviewed: 05/05/2015 Elsevier Interactive Patient Education  [employer].

## 2018-01-10 NOTE — Discharge Instructions (Signed)
General Anesthesia, Adult, Care After °These instructions provide you with information about caring for yourself after your procedure. Your health care provider may also give you more specific instructions. Your treatment has been planned according to current medical practices, but problems sometimes occur. Call your health care provider if you have any problems or questions after your procedure. °What can I expect after the procedure? °After the procedure, it is common to have: °· Vomiting. °· A sore throat. °· Mental slowness. ° °It is common to feel: °· Nauseous. °· Cold or shivery. °· Sleepy. °· Tired. °· Sore or achy, even in parts of your body where you did not have surgery. ° °Follow these instructions at home: °For at least 24 hours after the procedure: °· Do not: °? Participate in activities where you could fall or become injured. °? Drive. °? Use heavy machinery. °? Drink alcohol. °? Take sleeping pills or medicines that cause drowsiness. °? Make important decisions or sign legal documents. °? Take care of children on your own. °· Rest. °Eating and drinking °· If you vomit, drink water, juice, or soup when you can drink without vomiting. °· Drink enough fluid to keep your urine clear or pale yellow. °· Make sure you have little or no nausea before eating solid foods. °· Follow the diet recommended by your health care provider. °General instructions °· Have a responsible adult stay with you until you are awake and alert. °· Return to your normal activities as told by your health care provider. Ask your health care provider what activities are safe for you. °· Take over-the-counter and prescription medicines only as told by your health care provider. °· If you smoke, do not smoke without supervision. °· Keep all follow-up visits as told by your health care provider. This is important. °Contact a health care provider if: °· You continue to have nausea or vomiting at home, and medicines are not helpful. °· You  cannot drink fluids or start eating again. °· You cannot urinate after 8-12 hours. °· You develop a skin rash. °· You have fever. °· You have increasing redness at the site of your procedure. °Get help right away if: °· You have difficulty breathing. °· You have chest pain. °· You have unexpected bleeding. °· You feel that you are having a life-threatening or urgent problem. °This information is not intended to replace advice given to you by your health care provider. Make sure you discuss any questions you have with your health care provider. °Document Released: 04/25/2000 Document Revised: 06/22/2015 Document Reviewed: 01/01/2015 °Elsevier Interactive Patient Education © 2018 Elsevier Inc. ° °

## 2018-01-11 ENCOUNTER — Ambulatory Visit: Payer: 59 | Admitting: Anesthesiology

## 2018-01-11 ENCOUNTER — Encounter
Admission: RE | Disposition: A | Discharge status: Home / Self Care | Payer: Self-pay | Source: Home / Self Care | Attending: Gastroenterology

## 2018-01-11 ENCOUNTER — Ambulatory Visit
Admission: RE | Admit: 2018-01-11 | Discharge: 2018-01-11 | Disposition: A | Discharge status: Home / Self Care | Payer: 59 | Attending: Gastroenterology | Admitting: Gastroenterology

## 2018-01-11 DIAGNOSIS — Z87891 Personal history of nicotine dependence: Secondary | ICD-10-CM | POA: Insufficient documentation

## 2018-01-11 DIAGNOSIS — K263 Acute duodenal ulcer without hemorrhage or perforation: Secondary | ICD-10-CM | POA: Diagnosis not present

## 2018-01-11 DIAGNOSIS — K222 Esophageal obstruction: Secondary | ICD-10-CM | POA: Diagnosis not present

## 2018-01-11 DIAGNOSIS — K2980 Duodenitis without bleeding: Secondary | ICD-10-CM | POA: Insufficient documentation

## 2018-01-11 DIAGNOSIS — R1310 Dysphagia, unspecified: Secondary | ICD-10-CM

## 2018-01-11 DIAGNOSIS — K269 Duodenal ulcer, unspecified as acute or chronic, without hemorrhage or perforation: Secondary | ICD-10-CM | POA: Diagnosis not present

## 2018-01-11 DIAGNOSIS — G4730 Sleep apnea, unspecified: Secondary | ICD-10-CM | POA: Insufficient documentation

## 2018-01-11 DIAGNOSIS — Z791 Long term (current) use of non-steroidal anti-inflammatories (NSAID): Secondary | ICD-10-CM | POA: Insufficient documentation

## 2018-01-11 DIAGNOSIS — K319 Disease of stomach and duodenum, unspecified: Secondary | ICD-10-CM | POA: Diagnosis not present

## 2018-01-11 DIAGNOSIS — Z79899 Other long term (current) drug therapy: Secondary | ICD-10-CM | POA: Insufficient documentation

## 2018-01-11 DIAGNOSIS — R1319 Other dysphagia: Secondary | ICD-10-CM | POA: Diagnosis not present

## 2018-01-11 HISTORY — DX: Personal history of systemic steroid therapy: Z92.241

## 2018-01-11 HISTORY — DX: Dependence on other enabling machines and devices: Z99.89

## 2018-01-11 HISTORY — PX: ESOPHAGOGASTRODUODENOSCOPY (EGD) WITH PROPOFOL: SHX5813

## 2018-01-11 HISTORY — DX: Obstructive sleep apnea (adult) (pediatric): G47.33

## 2018-01-11 HISTORY — DX: Other cervical disc degeneration, unspecified cervical region: M50.30

## 2018-01-11 SURGERY — ESOPHAGOGASTRODUODENOSCOPY (EGD) WITH PROPOFOL
Anesthesia: General | Site: Esophagus

## 2018-01-11 MED ORDER — ACETAMINOPHEN 160 MG/5ML PO SOLN: 325.0000 mg | ORAL | Status: DC | PRN

## 2018-01-11 MED ORDER — ACETAMINOPHEN 325 MG PO TABS: 650.0000 mg | ORAL_TABLET | Freq: Once | ORAL | Status: DC | PRN

## 2018-01-11 MED ORDER — ONDANSETRON HCL 4 MG/2ML IJ SOLN: 4.0000 mg | Freq: Once | INTRAMUSCULAR | Status: DC | PRN

## 2018-01-11 MED ORDER — GLYCOPYRROLATE 0.2 MG/ML IJ SOLN
INTRAMUSCULAR | Status: DC | PRN
  Administered 2018-01-11: 0.1 mg via INTRAVENOUS

## 2018-01-11 MED ORDER — LIDOCAINE HCL (CARDIAC) PF 100 MG/5ML IV SOSY
PREFILLED_SYRINGE | INTRAVENOUS | Status: DC | PRN
  Administered 2018-01-11: 30 mg via INTRAVENOUS

## 2018-01-11 MED ORDER — PANTOPRAZOLE SODIUM 40 MG PO TBEC: 40.0000 mg | DELAYED_RELEASE_TABLET | Freq: Every day | ORAL | 11 refills | Status: AC

## 2018-01-11 MED ORDER — PROPOFOL 10 MG/ML IV BOLUS
INTRAVENOUS | Status: DC | PRN
  Administered 2018-01-11: 50 mg via INTRAVENOUS
  Administered 2018-01-11: 40 mg via INTRAVENOUS
  Administered 2018-01-11: 150 mg via INTRAVENOUS

## 2018-01-11 MED ORDER — LACTATED RINGERS IV SOLN
INTRAVENOUS | Status: DC
  Administered 2018-01-11: 12:00:00 via INTRAVENOUS

## 2018-01-11 SURGICAL SUPPLY — 33 items
BALLN DILATOR 10-12 8 (BALLOONS)
BALLN DILATOR 12-15 8 (BALLOONS) ×2
BALLN DILATOR 15-18 8 (BALLOONS) ×2
BALLN DILATOR CRE 0-12 8 (BALLOONS)
BALLN DILATOR ESOPH 8 10 CRE (MISCELLANEOUS) IMPLANT
BALLOON DILATOR 12-15 8 (BALLOONS) ×1 IMPLANT
BALLOON DILATOR 15-18 8 (BALLOONS) ×1 IMPLANT
BALLOON DILATOR CRE 0-12 8 (BALLOONS) IMPLANT
BLOCK BITE 60FR ADLT L/F GRN (MISCELLANEOUS) ×2 IMPLANT
CANISTER SUCT 1200ML W/VALVE (MISCELLANEOUS) ×2 IMPLANT
CLIP HMST 235XBRD CATH ROT (MISCELLANEOUS) IMPLANT
CLIP RESOLUTION 360 11X235 (MISCELLANEOUS)
ELECT REM PT RETURN 9FT ADLT (ELECTROSURGICAL)
ELECTRODE REM PT RTRN 9FT ADLT (ELECTROSURGICAL) IMPLANT
FCP ESCP3.2XJMB 240X2.8X (MISCELLANEOUS)
FORCEPS BIOP RAD 4 LRG CAP 4 (CUTTING FORCEPS) ×2 IMPLANT
FORCEPS BIOP RJ4 240 W/NDL (MISCELLANEOUS)
FORCEPS ESCP3.2XJMB 240X2.8X (MISCELLANEOUS) IMPLANT
GOWN CVR UNV OPN BCK APRN NK (MISCELLANEOUS) ×2 IMPLANT
GOWN ISOL THUMB LOOP REG UNIV (MISCELLANEOUS) ×2
INJECTOR VARIJECT VIN23 (MISCELLANEOUS) IMPLANT
KIT DEFENDO VALVE AND CONN (KITS) IMPLANT
KIT ENDO PROCEDURE OLY (KITS) ×2 IMPLANT
MARKER SPOT ENDO TATTOO 5ML (MISCELLANEOUS) IMPLANT
RETRIEVER NET PLAT FOOD (MISCELLANEOUS) IMPLANT
SNARE SHORT THROW 13M SML OVAL (MISCELLANEOUS) IMPLANT
SNARE SHORT THROW 30M LRG OVAL (MISCELLANEOUS) IMPLANT
SPOT EX ENDOSCOPIC TATTOO (MISCELLANEOUS)
SYR INFLATION 60ML (SYRINGE) IMPLANT
TRAP ETRAP POLY (MISCELLANEOUS) IMPLANT
VARIJECT INJECTOR VIN23 (MISCELLANEOUS)
WATER STERILE IRR 250ML POUR (IV SOLUTION) ×2 IMPLANT
WIRE CRE 18-20MM 8CM F G (MISCELLANEOUS) IMPLANT

## 2018-01-11 NOTE — Transfer of Care (Signed)
Immediate Anesthesia Transfer of Care Note  Patient: Shawn SparrowKeith Quinn  Procedure(s) Performed: ESOPHAGOGASTRODUODENOSCOPY (EGD) WITH PROPOFOL (N/A Esophagus) ESOPHAGEAL DILITATION (N/A Esophagus)  Patient Location: PACU  Anesthesia Type: General  Level of Consciousness: awake, alert  and patient cooperative  Airway and Oxygen Therapy: Patient Spontanous Breathing and Patient connected to supplemental oxygen  Post-op Assessment: Post-op Vital signs reviewed, Patient's Cardiovascular Status Stable, Respiratory Function Stable, Patent Airway and No signs of Nausea or vomiting  Post-op Vital Signs: Reviewed and stable  Complications: No apparent anesthesia complications

## 2018-01-11 NOTE — Anesthesia Postprocedure Evaluation (Signed)
Anesthesia Post Note  Patient: Shawn Quinn  Procedure(s) Performed: ESOPHAGOGASTRODUODENOSCOPY (EGD) WITH PROPOFOL (N/A Esophagus) ESOPHAGEAL DILITATION (N/A Esophagus)  Patient location during evaluation: PACU Anesthesia Type: General Level of consciousness: awake and alert, oriented and patient cooperative Pain management: pain level controlled Vital Signs Assessment: post-procedure vital signs reviewed and stable Respiratory status: spontaneous breathing, nonlabored ventilation and respiratory function stable Cardiovascular status: blood pressure returned to baseline and stable Postop Assessment: adequate PO intake Anesthetic complications: no    Reed BreechAndrea Aryiah Monterosso

## 2018-01-11 NOTE — Anesthesia Procedure Notes (Signed)
Date/Time: 01/11/2018 11:56 AM Performed by: Maree KrabbeWarren, Demorris Choyce, CRNA Pre-anesthesia Checklist: Patient identified, Emergency Drugs available, Suction available, Timeout performed and Patient being monitored Patient Re-evaluated:Patient Re-evaluated prior to induction Oxygen Delivery Method: Nasal cannula Placement Confirmation: positive ETCO2

## 2018-01-11 NOTE — Op Note (Addendum)
[facility] Patient Name: Shawn Quinn Procedure Date: 01/11/2018 11:42 AM MRN: [id number] Account #: [id number] Date of Birth: August 24, 1980 Admit Type: Outpatient Age: 37 Room: [facility] OR ROOM 01 Gender: Male Note Status: Finalized Procedure:            Upper GI endoscopy Indications:          Dysphagia Providers:            Midge Minium MD, MD Referring MD:         Dorcas Carrow (Referring MD) Medicines:            Propofol per Anesthesia Complications:        No immediate complications. Procedure:            Pre-Anesthesia Assessment:                       - Prior to the procedure, a History and Physical was                        performed, and patient medications and allergies were                        reviewed. The patient's tolerance of previous                        anesthesia was also reviewed. The risks and benefits of                        the procedure and the sedation options and risks were                        discussed with the patient. All questions were                        answered, and informed consent was obtained. Prior                        Anticoagulants: The patient has taken no previous                        anticoagulant or antiplatelet agents. ASA Grade                        Assessment: II - A patient with mild systemic disease.                        After reviewing the risks and benefits, the patient was                        deemed in satisfactory condition to undergo the                        procedure.                       After obtaining informed consent, the endoscope was                        passed under direct vision. Throughout the procedure,  the patient's blood pressure, pulse, and oxygen                        saturations were monitored continuously. The was                        introduced through the mouth, and advanced to the                        second part of  duodenum. The upper GI endoscopy was                        accomplished without difficulty. The patient tolerated                        the procedure well. Findings:      One benign-appearing, intrinsic severe stenosis was found at the       gastroesophageal junction. The stenosis was traversed. A TTS dilator was       passed through the scope. Dilation with a 12-13.5-15 mm balloon dilator       was performed to 15 mm. The dilation site was examined following       endoscope reinsertion and showed moderate improvement in luminal       narrowing.      The entire examined stomach was normal. Biopsies were taken with a cold       forceps for Helicobacter pylori testing.      One non-bleeding cratered duodenal ulcer with a clean ulcer base       (Forrest Class III) was found in the duodenal bulb.      Severe inflammation was found in the duodenal bulb. Impression:           - Benign-appearing esophageal stenosis. Dilated.                       - Normal stomach. Biopsied.                       - One non-bleeding duodenal ulcer with a clean ulcer                        base (Forrest Class III).                       - Duodenitis. Recommendation:       - Discharge patient to home.                       - Resume previous diet.                       - Continue present medications.                       - Use a proton pump inhibitor PO daily.                       - Repeat upper endoscopy in 4 weeks for retreatment. Procedure Code(s):    --- Professional ---                       [id number], Esophagogastroduodenoscopy, flexible, transoral;  with transendoscopic balloon dilation of esophagus                        (less than 30 mm diameter)                       43239, 59, Esophagogastroduodenoscopy, flexible,                        transoral; with biopsy, single or multiple Diagnosis Code(s):    --- Professional ---                       R13.10, Dysphagia, unspecified                        K29.80, Duodenitis without bleeding                       K26.9, Duodenal ulcer, unspecified as acute or chronic,                        without hemorrhage or perforation                       K22.2, Esophageal obstruction CPT copyright 2018 American Medical Association. All rights reserved. The codes documented in this report are preliminary and upon coder review may  be revised to meet current compliance requirements. Midge Minium MD, MD 01/11/2018 12:09:23 PM This report has been signed electronically. Number of Addenda: 0 Note Initiated On: 01/11/2018 11:42 AM      [facility]

## 2018-01-11 NOTE — Interval H&P Note (Signed)
History and Physical Interval Note:  01/11/2018 11:38 AM  Shawn Quinn  has presented today for surgery, with the diagnosis of Dsyphagia R13.10  The various methods of treatment have been discussed with the patient and family. After consideration of risks, benefits and other options for treatment, the patient has consented to  Procedure(s): ESOPHAGOGASTRODUODENOSCOPY (EGD) WITH PROPOFOL (N/A) as a surgical intervention .  The patient's history has been reviewed, patient examined, no change in status, stable for surgery.  I have reviewed the patient's chart and labs.  Questions were answered to the patient's satisfaction.     [name] FedExWohl

## 2018-01-12 ENCOUNTER — Telehealth: Payer: Self-pay | Admitting: Gastroenterology

## 2018-01-12 ENCOUNTER — Encounter: Payer: Self-pay | Admitting: Gastroenterology

## 2018-01-12 ENCOUNTER — Other Ambulatory Visit: Payer: Self-pay

## 2018-01-12 DIAGNOSIS — R1319 Other dysphagia: Secondary | ICD-10-CM

## 2018-01-12 DIAGNOSIS — R1310 Dysphagia, unspecified: Secondary | ICD-10-CM

## 2018-01-12 NOTE — Telephone Encounter (Signed)
Pt left vm he had a procedure yesterday and he had a question about scheduling another one also about a prescription

## 2018-01-12 NOTE — Telephone Encounter (Signed)
Pt scheduled a follow up [facility] on 02/08/18.

## 2018-01-22 DIAGNOSIS — G4733 Obstructive sleep apnea (adult) (pediatric): Secondary | ICD-10-CM | POA: Diagnosis not present

## 2018-02-07 NOTE — Discharge Instructions (Signed)
General Anesthesia, Adult, Care After  This sheet gives you information about how to care for yourself after your procedure. Your health care provider may also give you more specific instructions. If you have problems or questions, contact your health care provider.  What can I expect after the procedure?  After the procedure, the following side effects are common:  Pain or discomfort at the IV site.  Nausea.  Vomiting.  Sore throat.  Trouble concentrating.  Feeling cold or chills.  Weak or tired.  Sleepiness and fatigue.  Soreness and body aches. These side effects can affect parts of the body that were not involved in surgery.  Follow these instructions at home:    For at least 24 hours after the procedure:  Have a responsible adult stay with you. It is important to have someone help care for you until you are awake and alert.  Rest as needed.  Do not:  Participate in activities in which you could fall or become injured.  Drive.  Use heavy machinery.  Drink alcohol.  Take sleeping pills or medicines that cause drowsiness.  Make important decisions or sign legal documents.  Take care of children on your own.  Eating and drinking  Follow any instructions from your health care provider about eating or drinking restrictions.  When you feel hungry, start by eating small amounts of foods that are soft and easy to digest (bland), such as toast. Gradually return to your regular diet.  Drink enough fluid to keep your urine pale yellow.  If you vomit, rehydrate by drinking water, juice, or clear broth.  General instructions  If you have sleep apnea, surgery and certain medicines can increase your risk for breathing problems. Follow instructions from your health care provider about wearing your sleep device:  Anytime you are sleeping, including during daytime naps.  While taking prescription pain medicines, sleeping medicines, or medicines that make you drowsy.  Return to your normal activities as told by your health care  provider. Ask your health care provider what activities are safe for you.  Take over-the-counter and prescription medicines only as told by your health care provider.  If you smoke, do not smoke without supervision.  Keep all follow-up visits as told by your health care provider. This is important.  Contact a health care provider if:  You have nausea or vomiting that does not get better with medicine.  You cannot eat or drink without vomiting.  You have pain that does not get better with medicine.  You are unable to pass urine.  You develop a skin rash.  You have a fever.  You have redness around your IV site that gets worse.  Get help right away if:  You have difficulty breathing.  You have chest pain.  You have blood in your urine or stool, or you vomit blood.  Summary  After the procedure, it is common to have a sore throat or nausea. It is also common to feel tired.  Have a responsible adult stay with you for the first 24 hours after general anesthesia. It is important to have someone help care for you until you are awake and alert.  When you feel hungry, start by eating small amounts of foods that are soft and easy to digest (bland), such as toast. Gradually return to your regular diet.  Drink enough fluid to keep your urine pale yellow.  Return to your normal activities as told by your health care provider. Ask your health care   provider what activities are safe for you.  This information is not intended to replace advice given to you by your health care provider. Make sure you discuss any questions you have with your health care provider.  Document Released: 04/25/2000 Document Revised: 09/02/2016 Document Reviewed: 09/02/2016  Elsevier Interactive Patient Education  2019 [employer] Inc[employer]

## 2018-02-08 ENCOUNTER — Ambulatory Visit
Admission: RE | Admit: 2018-02-08 | Discharge: 2018-02-08 | Disposition: A | Discharge status: Home / Self Care | Payer: 59 | Attending: Gastroenterology | Admitting: Gastroenterology

## 2018-02-08 ENCOUNTER — Encounter
Admission: RE | Disposition: A | Discharge status: Home / Self Care | Payer: Self-pay | Source: Home / Self Care | Attending: Gastroenterology

## 2018-02-08 ENCOUNTER — Ambulatory Visit: Payer: 59 | Admitting: Anesthesiology

## 2018-02-08 DIAGNOSIS — Z791 Long term (current) use of non-steroidal anti-inflammatories (NSAID): Secondary | ICD-10-CM | POA: Insufficient documentation

## 2018-02-08 DIAGNOSIS — G4733 Obstructive sleep apnea (adult) (pediatric): Secondary | ICD-10-CM | POA: Insufficient documentation

## 2018-02-08 DIAGNOSIS — K449 Diaphragmatic hernia without obstruction or gangrene: Secondary | ICD-10-CM | POA: Insufficient documentation

## 2018-02-08 DIAGNOSIS — R1319 Other dysphagia: Secondary | ICD-10-CM

## 2018-02-08 DIAGNOSIS — Z87891 Personal history of nicotine dependence: Secondary | ICD-10-CM | POA: Diagnosis not present

## 2018-02-08 DIAGNOSIS — Z9989 Dependence on other enabling machines and devices: Secondary | ICD-10-CM | POA: Insufficient documentation

## 2018-02-08 DIAGNOSIS — K222 Esophageal obstruction: Secondary | ICD-10-CM | POA: Insufficient documentation

## 2018-02-08 DIAGNOSIS — K2980 Duodenitis without bleeding: Secondary | ICD-10-CM | POA: Insufficient documentation

## 2018-02-08 DIAGNOSIS — E669 Obesity, unspecified: Secondary | ICD-10-CM | POA: Diagnosis not present

## 2018-02-08 DIAGNOSIS — Z6836 Body mass index (BMI) 36.0-36.9, adult: Secondary | ICD-10-CM | POA: Diagnosis not present

## 2018-02-08 DIAGNOSIS — Z79899 Other long term (current) drug therapy: Secondary | ICD-10-CM | POA: Diagnosis not present

## 2018-02-08 DIAGNOSIS — R1310 Dysphagia, unspecified: Secondary | ICD-10-CM | POA: Diagnosis not present

## 2018-02-08 HISTORY — PX: ESOPHAGEAL DILATION: SHX303

## 2018-02-08 HISTORY — PX: ESOPHAGOGASTRODUODENOSCOPY (EGD) WITH PROPOFOL: SHX5813

## 2018-02-08 SURGERY — ESOPHAGOGASTRODUODENOSCOPY (EGD) WITH PROPOFOL
Anesthesia: General | Site: Throat

## 2018-02-08 MED ORDER — STERILE WATER FOR IRRIGATION IR SOLN
Status: DC | PRN
  Administered 2018-02-08: 11:00:00

## 2018-02-08 MED ORDER — LIDOCAINE HCL (CARDIAC) PF 100 MG/5ML IV SOSY
PREFILLED_SYRINGE | INTRAVENOUS | Status: DC | PRN
  Administered 2018-02-08: 40 mg via INTRAVENOUS

## 2018-02-08 MED ORDER — LACTATED RINGERS IV SOLN
10.0000 mL/h | INTRAVENOUS | Status: DC
  Administered 2018-02-08: 10 mL/h via INTRAVENOUS

## 2018-02-08 MED ORDER — GLYCOPYRROLATE 0.2 MG/ML IJ SOLN
INTRAMUSCULAR | Status: DC | PRN
  Administered 2018-02-08: 0.1 mg via INTRAVENOUS

## 2018-02-08 MED ORDER — SODIUM CHLORIDE 0.9 % IV SOLN: INTRAVENOUS | Status: DC

## 2018-02-08 MED ORDER — ONDANSETRON HCL 4 MG/2ML IJ SOLN: 4.0000 mg | Freq: Once | INTRAMUSCULAR | Status: DC | PRN

## 2018-02-08 MED ORDER — PROPOFOL 10 MG/ML IV BOLUS
INTRAVENOUS | Status: DC | PRN
  Administered 2018-02-08 (×5): 20 mg via INTRAVENOUS
  Administered 2018-02-08: 100 mg via INTRAVENOUS

## 2018-02-08 SURGICAL SUPPLY — 10 items
BALLN DILATOR 15-18 8 (BALLOONS) ×3
BALLOON DILATOR 15-18 8 (BALLOONS) IMPLANT
BLOCK BITE 60FR ADLT L/F GRN (MISCELLANEOUS) ×3 IMPLANT
CANISTER SUCT 1200ML W/VALVE (MISCELLANEOUS) ×3 IMPLANT
FORCEPS BIOP RAD 4 LRG CAP 4 (CUTTING FORCEPS) ×1 IMPLANT
GOWN CVR UNV OPN BCK APRN NK (MISCELLANEOUS) ×4 IMPLANT
GOWN ISOL THUMB LOOP REG UNIV (MISCELLANEOUS) ×2
KIT ENDO PROCEDURE OLY (KITS) ×3 IMPLANT
SYR INFLATION 60ML (SYRINGE) ×1 IMPLANT
WATER STERILE IRR 250ML POUR (IV SOLUTION) ×3 IMPLANT

## 2018-02-08 NOTE — Transfer of Care (Signed)
Immediate Anesthesia Transfer of Care Note  Patient: Shawn Quinn  Procedure(s) Performed: ESOPHAGOGASTRODUODENOSCOPY (EGD) WITH BIOPSIES (N/A Throat) ESOPHAGEAL DILATION (N/A Esophagus)  Patient Location: PACU  Anesthesia Type: General  Level of Consciousness: awake, alert  and patient cooperative  Airway and Oxygen Therapy: Patient Spontanous Breathing and Patient connected to supplemental oxygen  Post-op Assessment: Post-op Vital signs reviewed, Patient's Cardiovascular Status Stable, Respiratory Function Stable, Patent Airway and No signs of Nausea or vomiting  Post-op Vital Signs: Reviewed and stable  Complications: No apparent anesthesia complications

## 2018-02-08 NOTE — Anesthesia Procedure Notes (Signed)
Performed by: Katara Griner, CRNA Pre-anesthesia Checklist: Patient identified, Emergency Drugs available, Suction available, Timeout performed and Patient being monitored Patient Re-evaluated:Patient Re-evaluated prior to induction Oxygen Delivery Method: Nasal cannula Placement Confirmation: positive ETCO2       

## 2018-02-08 NOTE — Anesthesia Postprocedure Evaluation (Signed)
Anesthesia Post Note  Patient: Shawn Quinn  Procedure(s) Performed: ESOPHAGOGASTRODUODENOSCOPY (EGD) WITH BIOPSIES (N/A Throat) ESOPHAGEAL DILATION (N/A Esophagus)  Patient location during evaluation: PACU Anesthesia Type: General Level of consciousness: awake Pain management: pain level controlled Vital Signs Assessment: post-procedure vital signs reviewed and stable Respiratory status: respiratory function stable Cardiovascular status: stable Postop Assessment: no signs of nausea or vomiting Anesthetic complications: no    Jola Babinski

## 2018-02-08 NOTE — H&P (Signed)
Midge Minium, MD [facility] [address], [address] [place], [place] [address] [phone] Fax : [phone]  Primary Care Physician:  Dorcas Carrow, DO Primary Gastroenterologist:  Dr. Servando Snare  Pre-Procedure History & Physical: HPI:  Shawn Quinn is a 38 y.o. male is here for an endoscopy.   Past Medical History:  Diagnosis Date  . Anxiety   . DDD (degenerative disc disease), cervical    C5-6  . Depression   . OSA on CPAP   . S/P epidural steroid injection    cervical    Past Surgical History:  Procedure Laterality Date  . ESOPHAGOGASTRODUODENOSCOPY (EGD) WITH PROPOFOL N/A 01/11/2018   Procedure: ESOPHAGOGASTRODUODENOSCOPY (EGD) WITH PROPOFOL;  Surgeon: Midge Minium, MD;  Location: [facility];  Service: Endoscopy;  Laterality: N/A;  . WISDOM TOOTH EXTRACTION  38yo   38yo  . WISDOM TOOTH EXTRACTION Bilateral 2002    Prior to Admission medications   Medication Sig Start Date End Date Taking? Authorizing Provider  Cholecalciferol (VITAMIN D3) 5000 units CAPS Take 1 capsule (5,000 Units total) by mouth daily with breakfast. Take along with calcium and magnesium. 09/04/17 03/03/18 Yes Delano Metz, MD  Dexlansoprazole (DEXILANT) 30 MG capsule Take 30 mg by mouth daily.   Yes [provider]  fexofenadine-pseudoephedrine (ALLEGRA-D) 60-120 MG 12 hr tablet Take 1 tablet by mouth 2 (two) times daily.   Yes [provider]  gabapentin (NEURONTIN) 100 MG capsule Take 100 mg by mouth daily.   Yes [provider]  ibuprofen (ADVIL,MOTRIN) 600 MG tablet Take 1 tablet (600 mg total) by mouth every 6 (six) hours as needed. 08/10/17  Yes Dionne Bucy, MD  Magnesium 500 MG CAPS Take 1 capsule (500 mg total) by mouth 2 (two) times daily at 8 am and 10 pm. 09/04/17 03/03/18 Yes Delano Metz, MD  meloxicam (MOBIC) 7.5 MG tablet Take 7.5 mg by mouth daily.   Yes [provider]  metroNIDAZOLE (METROGEL) 1 % gel Apply topically daily.  12/25/17  Yes Johnson, Megan P, DO  Multiple Vitamin (CALCIUM COMPLEX PO) Take 100 mg by mouth daily.   Yes [provider]  pantoprazole (PROTONIX) 40 MG tablet Take 1 tablet (40 mg total) by mouth daily before breakfast. 01/11/18  Yes Midge Minium, MD  tiZANidine (ZANAFLEX) 4 MG tablet Take 4 mg by mouth daily.   Yes [provider]    Allergies as of 01/12/2018  . (No Known Allergies)    Family History  Problem Relation Age of Onset  . Drug abuse Father   . Multiple sclerosis Father   . Cancer Maternal Grandmother        Breast and lung    Social History   Socioeconomic History  . Marital status: Single    Spouse name: Not on file  . Number of children: Not on file  . Years of education: Not on file  . Highest education level: Not on file  Occupational History  . Not on file  Social Needs  . Financial resource strain: Not on file  . Food insecurity:    Worry: Not on file    Inability: Not on file  . Transportation needs:    Medical: Not on file    Non-medical: Not on file  Tobacco Use  . Smoking status: Former Smoker    Types: Cigarettes    Last attempt to quit: 2016    Years since quitting: 4.0  . Smokeless tobacco: Former Neurosurgeon    Types: Chew  Substance  and Sexual Activity  . Alcohol use: Yes    Comment: occassional   . Drug use: Never  . Sexual activity: Yes  Lifestyle  . Physical activity:    Days per week: Not on file    Minutes per session: Not on file  . Stress: Not on file  Relationships  . Social connections:    Talks on phone: Not on file    Gets together: Not on file    Attends religious service: Not on file    Active member of club or organization: Not on file    Attends meetings of clubs or organizations: Not on file    Relationship status: Not on file  . Intimate partner violence:    Fear of current or ex partner: Not on file    Emotionally abused: Not on file    Physically abused: Not on file    Forced sexual  activity: Not on file  Other Topics Concern  . Not on file  Social History Narrative  . Not on file    Review of Systems: See HPI, otherwise negative ROS  Physical Exam: BP (!) 142/81   Pulse 83   Temp 98.1 F (36.7 C) (Temporal)   Resp 16   Ht 5\' 7"  (1.702 m)   Wt 104.3 kg   SpO2 97%   BMI 36.02 kg/m  General:   Alert,  pleasant and cooperative in NAD Head:  Normocephalic and atraumatic. Neck:  Supple; no masses or thyromegaly. Lungs:  Clear throughout to auscultation.    Heart:  Regular rate and rhythm. Abdomen:  Soft, nontender and nondistended. Normal bowel sounds, without guarding, and without rebound.   Neurologic:  Alert and  oriented x4;  grossly normal neurologically.  Impression/Plan: Shawn Quinn is here for an endoscopy to be performed for dysphagia  Risks, benefits, limitations, and alternatives regarding  endoscopy have been reviewed with the patient.  Questions have been answered.  All parties agreeable.   Midge Miniumarren Essense Bousquet, MD  02/08/2018, 9:42 AM

## 2018-02-08 NOTE — Op Note (Signed)
[facility] Gastroenterology Patient Name: Shawn SparrowKeith Quinn Procedure Date: 02/08/2018 10:29 AM MRN: [id number] Account #: [id number] Date of Birth: 10/08/1980 Admit Type: Outpatient Age: 38 [facility] Gender: Male Note Status: Finalized Procedure:            Upper GI endoscopy Indications:          Dysphagia Providers:            Midge Miniumarren Tilak Oakley MD, MD Referring MD:         Dorcas CarrowMegan P. Johnson (Referring MD) Medicines:            Propofol per Anesthesia Complications:        No immediate complications. Procedure:            Pre-Anesthesia Assessment:                       - Prior to the procedure, a History and Physical was                        performed, and patient medications and allergies were                        reviewed. The patient's tolerance of previous                        anesthesia was also reviewed. The risks and benefits of                        the procedure and the sedation options and risks were                        discussed with the patient. All questions were                        answered, and informed consent was obtained. Prior                        Anticoagulants: The patient has taken no previous                        anticoagulant or antiplatelet agents. ASA Grade                        Assessment: II - A patient with mild systemic disease.                        After reviewing the risks and benefits, the patient was                        deemed in satisfactory condition to undergo the                        procedure.                       After obtaining informed consent, the endoscope was                        passed under direct vision. Throughout the procedure,  the patient's blood pressure, pulse, and oxygen                        saturations were monitored continuously. The was                        introduced through the mouth, and advanced to the                        second part of  duodenum. The upper GI endoscopy was                        accomplished without difficulty. The patient tolerated                        the procedure well. Findings:      A small hiatal hernia was present.      One benign-appearing, intrinsic moderate stenosis was found at the       gastroesophageal junction. The stenosis was traversed. A TTS dilator was       passed through the scope. Dilation with a 15-16.5-18 mm balloon dilator       was performed to 18 mm. The dilation site was examined following       endoscope reinsertion and showed complete resolution of luminal       narrowing.      Two biopsies were obtained with cold forceps for histology in the middle       third of the esophagus.      The entire examined stomach was normal. Biopsies were taken with a cold       forceps for histology.      Localized severe inflammation characterized by erythema was found in the       duodenal bulb. Impression:           - Small hiatal hernia.                       - Benign-appearing esophageal stenosis. Dilated.                       - Normal stomach. Biopsied.                       - Duodenitis.                       - Biopsy performed in the middle third of the esophagus. Recommendation:       - Discharge patient to home.                       - Resume previous diet.                       - Continue present medications.                       - Await pathology results. Procedure Code(s):    --- Professional ---                       [phone], Esophagogastroduodenoscopy, flexible, transoral;                        with  transendoscopic balloon dilation of esophagus                        (less than 30 mm diameter)                       43239, 59, Esophagogastroduodenoscopy, flexible,                        transoral; with biopsy, single or multiple Diagnosis Code(s):    --- Professional ---                       R13.10, Dysphagia, unspecified                       K22.2, Esophageal  obstruction                       K29.80, Duodenitis without bleeding CPT copyright 2018 American Medical Association. All rights reserved. The codes documented in this report are preliminary and upon coder review may  be revised to meet current compliance requirements. Midge Miniumarren Eleanna Theilen MD, MD 02/08/2018 10:46:11 AM This report has been signed electronically. Number of Addenda: 0 Note Initiated On: 02/08/2018 10:29 AM      [facility]

## 2018-02-08 NOTE — Anesthesia Preprocedure Evaluation (Signed)
Anesthesia Evaluation  Patient identified by MRN, date of birth, ID band Patient awake    Reviewed: Allergy & Precautions, NPO status , Patient's Chart, lab work & pertinent test results  History of Anesthesia Complications (+) DIFFICULT IV STICK / SPECIAL LINE and history of anesthetic complications  Airway Mallampati: III   Neck ROM: Full    Dental no notable dental hx.    Pulmonary sleep apnea and Continuous Positive Airway Pressure Ventilation , former smoker,    breath sounds clear to auscultation       Cardiovascular Exercise Tolerance: Good negative cardio ROS   Rhythm:Regular Rate:Normal     Neuro/Psych PSYCHIATRIC DISORDERS Anxiety Depression DDD    GI/Hepatic Hx duodenal ulcer Dysphagia   Endo/Other  Obesity - BMI 34  Renal/GU      Musculoskeletal  (+) Arthritis ,   Abdominal   Peds  Hematology   Anesthesia Other Findings   Reproductive/Obstetrics                             Anesthesia Physical  Anesthesia Plan  ASA: II  Anesthesia Plan: General   Post-op Pain Management:    Induction: Intravenous  PONV Risk Score and Plan: 2 and Propofol infusion and TIVA  Airway Management Planned: Natural Airway and Nasal Cannula  Additional Equipment:   Intra-op Plan:   Post-operative Plan:   Informed Consent: I have reviewed the patients History and Physical, chart, labs and discussed the procedure including the risks, benefits and alternatives for the proposed anesthesia with the patient or authorized representative who has indicated his/her understanding and acceptance.     Plan Discussed with: CRNA  Anesthesia Plan Comments:         Anesthesia Quick Evaluation

## 2018-02-09 ENCOUNTER — Ambulatory Visit
Admission: RE | Admit: 2018-02-09 | Discharge: 2018-02-09 | Disposition: A | Discharge status: Home / Self Care | Payer: 59 | Source: Ambulatory Visit | Attending: Family Medicine | Admitting: Family Medicine

## 2018-02-09 ENCOUNTER — Ambulatory Visit (INDEPENDENT_AMBULATORY_CARE_PROVIDER_SITE_OTHER): Payer: 59 | Admitting: Family Medicine

## 2018-02-09 ENCOUNTER — Encounter: Payer: Self-pay | Admitting: Gastroenterology

## 2018-02-09 ENCOUNTER — Other Ambulatory Visit: Payer: Self-pay

## 2018-02-09 ENCOUNTER — Encounter: Payer: Self-pay | Admitting: Family Medicine

## 2018-02-09 VITALS — BP 138/98 | HR 74 | Temp 98.4°F | Ht 67.0 in | Wt 230.0 lb

## 2018-02-09 DIAGNOSIS — M9903 Segmental and somatic dysfunction of lumbar region: Secondary | ICD-10-CM

## 2018-02-09 DIAGNOSIS — M25552 Pain in left hip: Secondary | ICD-10-CM

## 2018-02-09 DIAGNOSIS — M9904 Segmental and somatic dysfunction of sacral region: Secondary | ICD-10-CM

## 2018-02-09 DIAGNOSIS — M542 Cervicalgia: Secondary | ICD-10-CM

## 2018-02-09 DIAGNOSIS — M9905 Segmental and somatic dysfunction of pelvic region: Secondary | ICD-10-CM

## 2018-02-09 DIAGNOSIS — M546 Pain in thoracic spine: Secondary | ICD-10-CM | POA: Diagnosis not present

## 2018-02-09 DIAGNOSIS — L719 Rosacea, unspecified: Secondary | ICD-10-CM

## 2018-02-09 DIAGNOSIS — M25512 Pain in left shoulder: Secondary | ICD-10-CM

## 2018-02-09 DIAGNOSIS — S299XXA Unspecified injury of thorax, initial encounter: Secondary | ICD-10-CM | POA: Diagnosis not present

## 2018-02-09 NOTE — Patient Instructions (Signed)
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
## 2018-02-09 NOTE — Assessment & Plan Note (Signed)
Metrogel seems to be making it worse. Would like to see dermatology. Referral generated today.

## 2018-02-12 ENCOUNTER — Encounter: Payer: Self-pay | Admitting: Family Medicine

## 2018-02-13 ENCOUNTER — Encounter: Payer: Self-pay | Admitting: Gastroenterology

## 2018-02-22 DIAGNOSIS — G4733 Obstructive sleep apnea (adult) (pediatric): Secondary | ICD-10-CM | POA: Diagnosis not present

## 2018-03-14 DIAGNOSIS — L219 Seborrheic dermatitis, unspecified: Secondary | ICD-10-CM | POA: Diagnosis not present

## 2018-03-25 DIAGNOSIS — G4733 Obstructive sleep apnea (adult) (pediatric): Secondary | ICD-10-CM | POA: Diagnosis not present

## 2018-04-23 DIAGNOSIS — G4733 Obstructive sleep apnea (adult) (pediatric): Secondary | ICD-10-CM | POA: Diagnosis not present

## 2018-04-25 DIAGNOSIS — G4733 Obstructive sleep apnea (adult) (pediatric): Secondary | ICD-10-CM | POA: Diagnosis not present

## 2018-05-23 ENCOUNTER — Telehealth: Payer: Self-pay | Admitting: Gastroenterology

## 2018-05-23 NOTE — Telephone Encounter (Signed)
Received a fax from [facility] regarding Medical clarification rx Pantoprazole SOD EC TB 40 mg  Pt has transferred rx from local pharmacy where rx was never filled. In order to not disrupt therapy we will ship this rx. We want to inform you in case you would like to send new prescription for future refills. Order# [id number] call [phone]

## 2018-05-23 NOTE — Telephone Encounter (Signed)
Spoke with pt regarding recent refill request from [facility]. Pt stated due to his insurance change, he is required to have long term meds sent to them. Pt stated he already has enough refills for now and will let me know when he is ready for one.

## 2018-05-24 DIAGNOSIS — G4733 Obstructive sleep apnea (adult) (pediatric): Secondary | ICD-10-CM | POA: Diagnosis not present

## 2018-07-30 ENCOUNTER — Encounter: Payer: Self-pay | Admitting: Family Medicine

## 2018-08-09 ENCOUNTER — Telehealth: Payer: Self-pay | Admitting: Family Medicine

## 2018-08-09 NOTE — Telephone Encounter (Signed)
Form filled out. OK to attack med list and fax. Thanks.

## 2018-08-09 NOTE — Telephone Encounter (Signed)
Copied from [place] [id number]. Topic: General - Other >> Aug 08, 2018 11:31 AM Yvette Rack wrote: Reason for CRM: Pt called to see if the paperwork that he submitted had been completed and faxed back to the number that was listed. Pt requests call back. >> Aug 09, 2018  9:00 AM Amada Kingfisher, CMA wrote: Please advise.

## 2018-08-09 NOTE — Telephone Encounter (Signed)
Shawn Quinn had form in her office she states that it can be filled out by pcp. Routing to provider. Form is at my desk currently

## 2018-08-09 NOTE — Telephone Encounter (Signed)
This form was in my folder to ask Alwyn Ren if we can fill it out and is not there now- has anyone seen it? It's a DOT form.

## 2019-07-31 IMAGING — CT CT RENAL STONE PROTOCOL
2 of 4 series · 16 of 46 positions shown, 18 images · non-contrast
Comparison: None.

CLINICAL DATA: Right flank pain with nausea and vomiting

EXAM:
CT ABDOMEN AND PELVIS WITHOUT CONTRAST
TECHNIQUE: Multidetector CT imaging of the abdomen and pelvis was performed
following the standard protocol without oral or IV contrast.

[Series 2: stone full standard · axial · 0.70mm/px · z∈[-1060,-640]mm · 13 of 92 slices shown, 15 images]
[im 4/92  soft-tissue]
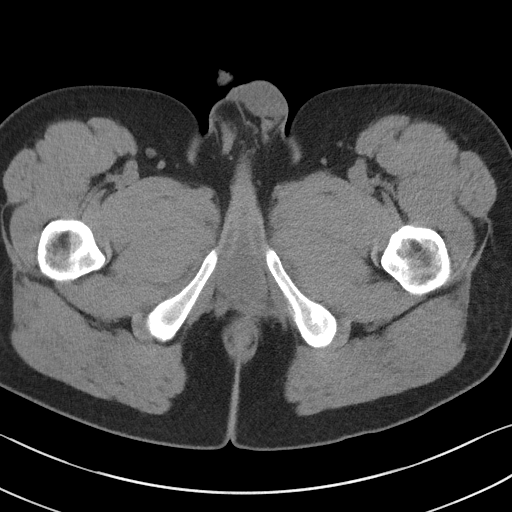
[im 4/92  bone]
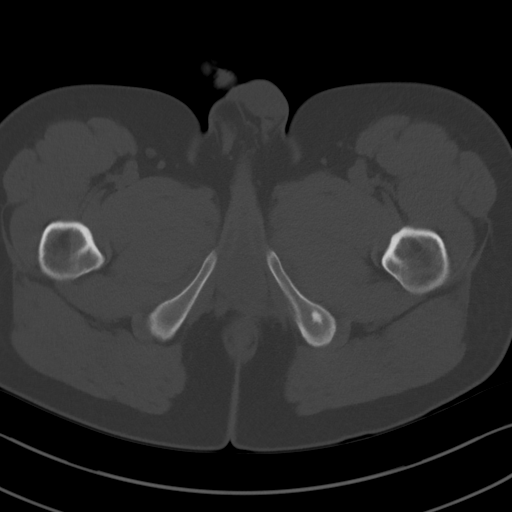
[im 12/92  soft-tissue]
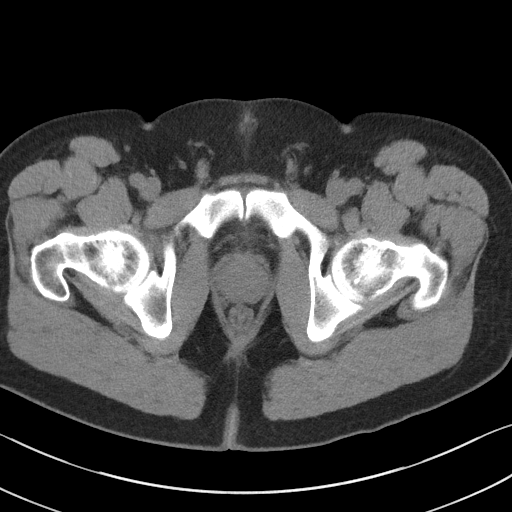
[im 19/92  soft-tissue]
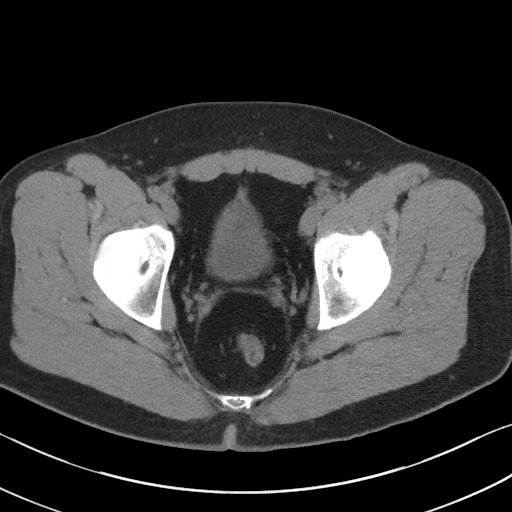
[im 27/92  soft-tissue]
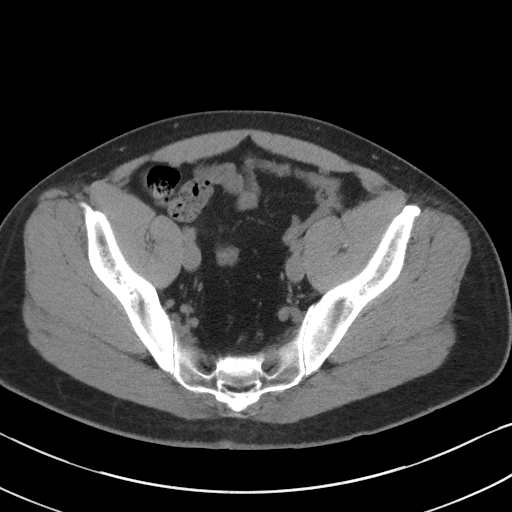
[im 31/92  soft-tissue]
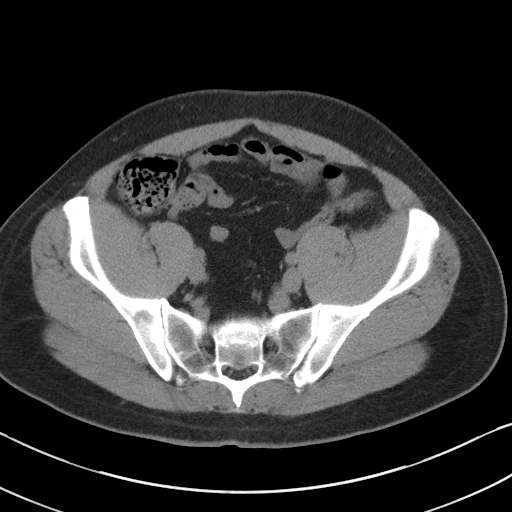
[im 38/92  soft-tissue]
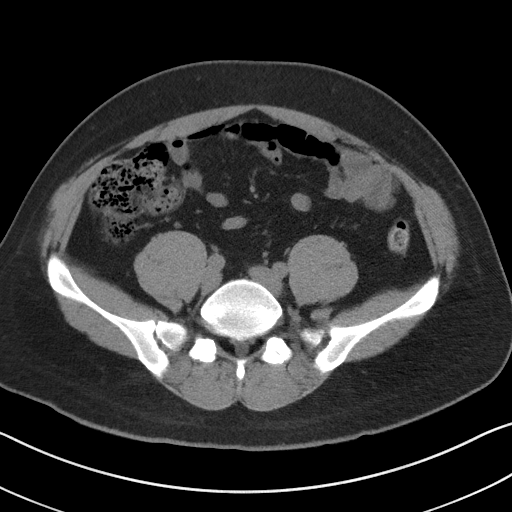
[im 46/92  soft-tissue]
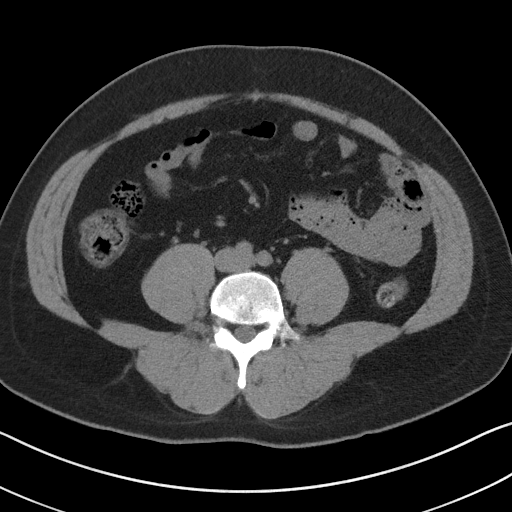
[im 54/92  soft-tissue]
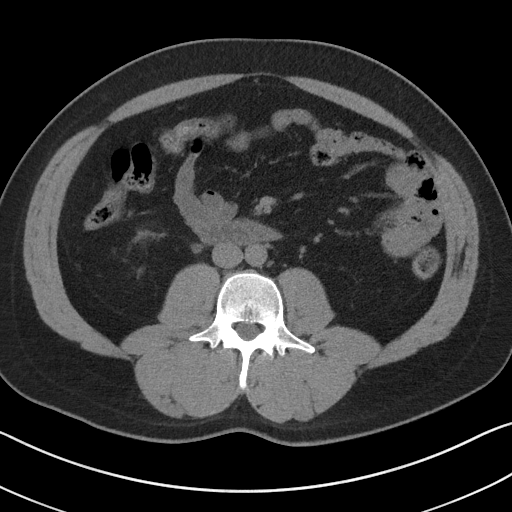
[im 61/92  soft-tissue]
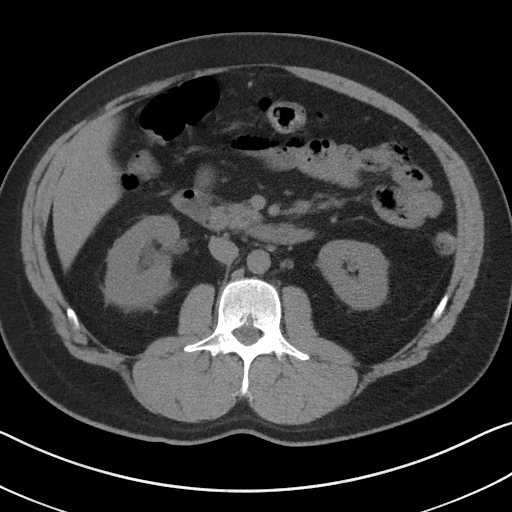
[im 61/92  bone]
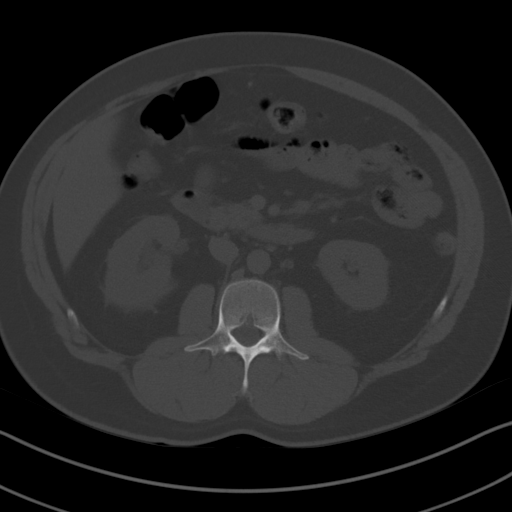
[im 65/92  soft-tissue]
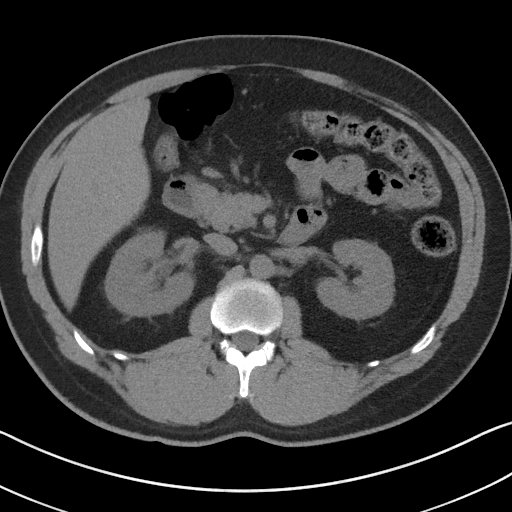
[im 73/92  soft-tissue]
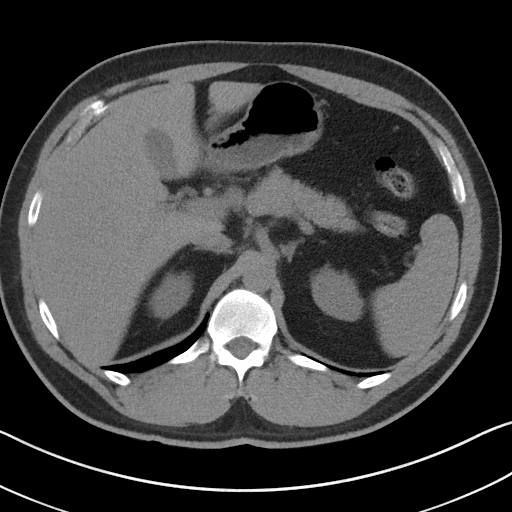
[im 80/92  soft-tissue]
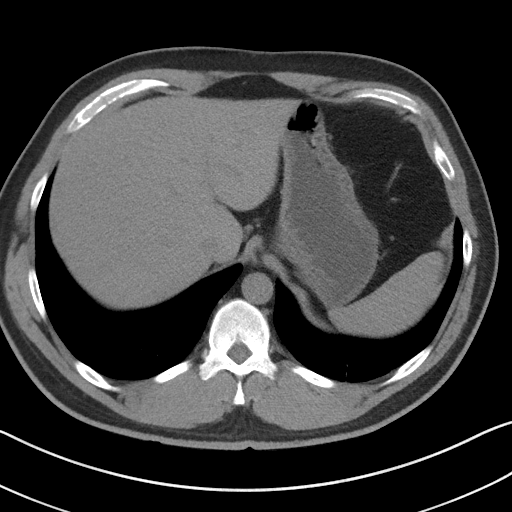
[im 88/92  soft-tissue]
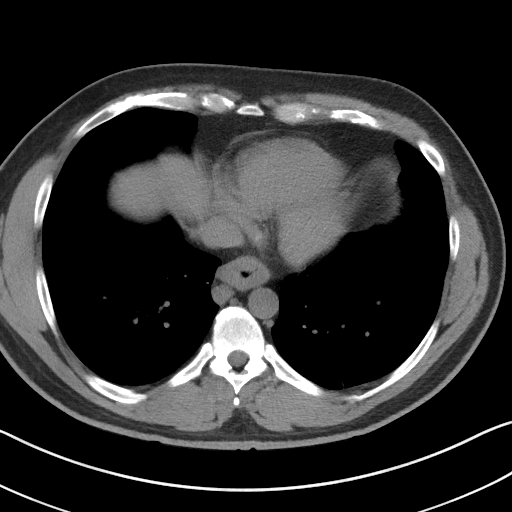

[Series 5: coronal · coronal · 0.73mm/px · 3 of 145 slices shown]
[im 49/145  soft-tissue]
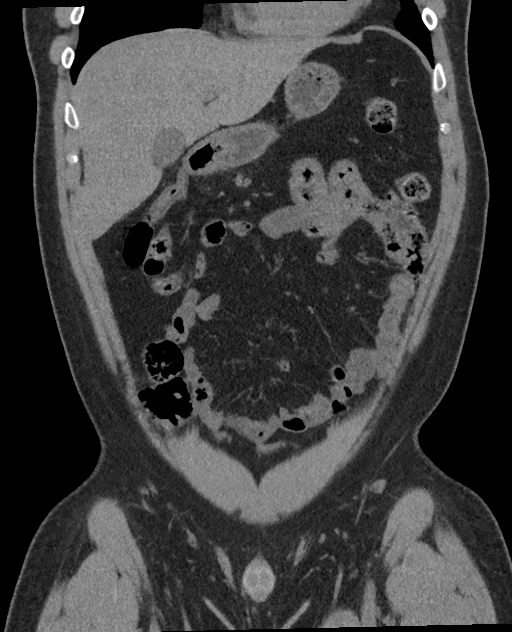
[im 65/145  soft-tissue]
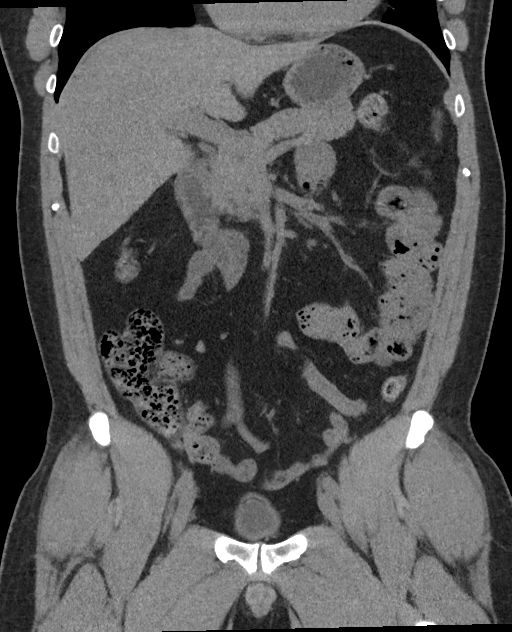
[im 81/145  soft-tissue]
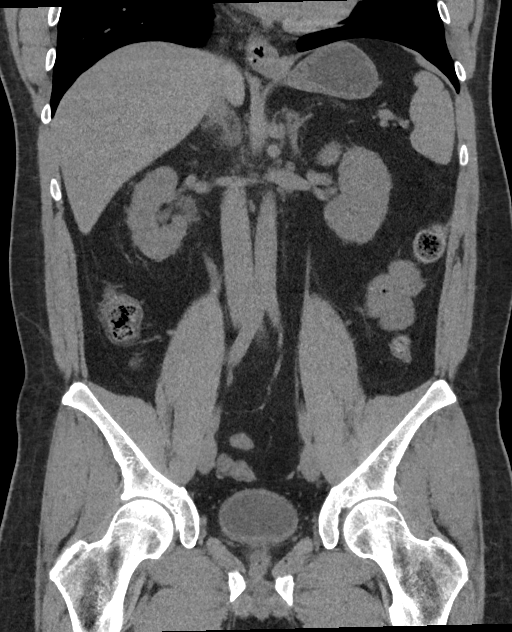

[16 of 46 positions shown; findings below may reference images not displayed]

FINDINGS: Lower chest: There is slight atelectatic change in the posterior
left base. Lung bases otherwise are clear. There is a focal hiatal
hernia.

Hepatobiliary: No focal liver lesions are evident on this
noncontrast enhanced study. Gallbladder wall is not appreciably
thickened. There is no biliary duct dilatation.

Pancreas: There is no pancreatic mass or inflammatory focus.

Spleen: No splenic lesions are evident.

Adrenals/Urinary Tract: Adrenals bilaterally appear normal. There is
mild right renal edema. There is no renal mass on either side. There
is moderate hydronephrosis on the right. There is no appreciable
hydronephrosis on the left. There is no intrarenal calculus on
either side. There is a 3 mm calculus in the proximal right ureter
at the level of L3-4. No other ureteral calculus is evident on
either side. Urinary bladder is midline with wall thickness within
normal limits for degree of distention.

Stomach/Bowel: There is no appreciable bowel wall or mesenteric
thickening. No evident bowel obstruction. No free air or portal
venous air.

Vascular/Lymphatic: No abdominal aortic aneurysm. No vascular
lesions are evident on this noncontrast enhanced study. There is no
adenopathy in the abdomen or pelvis.

Reproductive: Prostate and seminal vesicles are normal in size and
contour. There is no evident pelvic mass.

Other: Appendix appears normal. There is no ascites or abscess in
the abdomen or pelvis. There is a minimal ventral hernia containing
only fat.

Musculoskeletal: There are no blastic or lytic bone lesions. There
is a small apparent bone island in the left ischium. No
intramuscular or abdominal wall lesions evident.
IMPRESSION: 1. 3 mm calculus right ureter at the L3-4 level causing moderate
hydronephrosis on the right. There is right renal edema with mild
perinephric stranding on the right.

2. No bowel obstruction. No abscess in the abdomen or pelvis.
Appendix appears normal.

3. Minimal ventral hernia containing only fat. There is a hiatal
hernia.
